# Patient Record
Sex: Male | Born: 1970 | Hispanic: Refuse to answer | Marital: Married | Smoking: Never smoker
Health system: Southern US, Community
[De-identification: ages and names within clinical notes are randomized; demographics above are authoritative.]

## PROBLEM LIST (undated history)

## (undated) DIAGNOSIS — E7800 Pure hypercholesterolemia, unspecified: Secondary | ICD-10-CM

## (undated) DIAGNOSIS — L0501 Pilonidal cyst with abscess: Secondary | ICD-10-CM

## (undated) DIAGNOSIS — Z8719 Personal history of other diseases of the digestive system: Secondary | ICD-10-CM

## (undated) HISTORY — DX: Pilonidal cyst with abscess: L05.01

## (undated) HISTORY — PX: PILONIDAL CYST EXCISION: SHX744

## (undated) HISTORY — DX: Pure hypercholesterolemia, unspecified: E78.00

## (undated) HISTORY — PX: INGUINAL HERNIA REPAIR: SUR1180

## (undated) HISTORY — DX: Personal history of other diseases of the digestive system: Z87.19

---

## 2015-03-14 ENCOUNTER — Ambulatory Visit (INDEPENDENT_AMBULATORY_CARE_PROVIDER_SITE_OTHER): Payer: BLUE CROSS/BLUE SHIELD | Admitting: Family

## 2015-03-14 ENCOUNTER — Encounter: Payer: Self-pay | Admitting: Family

## 2015-03-14 ENCOUNTER — Other Ambulatory Visit (INDEPENDENT_AMBULATORY_CARE_PROVIDER_SITE_OTHER): Payer: BLUE CROSS/BLUE SHIELD

## 2015-03-14 VITALS — BP 124/96 | HR 73 | Temp 98.3°F | Resp 18 | Ht 72.0 in | Wt 256.0 lb

## 2015-03-14 DIAGNOSIS — R5383 Other fatigue: Secondary | ICD-10-CM | POA: Diagnosis not present

## 2015-03-14 DIAGNOSIS — R202 Paresthesia of skin: Secondary | ICD-10-CM

## 2015-03-14 DIAGNOSIS — L0501 Pilonidal cyst with abscess: Secondary | ICD-10-CM | POA: Insufficient documentation

## 2015-03-14 DIAGNOSIS — R200 Anesthesia of skin: Secondary | ICD-10-CM

## 2015-03-14 HISTORY — DX: Pilonidal cyst with abscess: L05.01

## 2015-03-14 LAB — IBC PANEL
Iron: 97 ug/dL (ref 42–165)
SATURATION RATIOS: 25.2 % (ref 20.0–50.0)
Transferrin: 275 mg/dL (ref 212.0–360.0)

## 2015-03-14 LAB — CBC
HCT: 47.8 % (ref 39.0–52.0)
HEMOGLOBIN: 16.3 g/dL (ref 13.0–17.0)
MCHC: 34 g/dL (ref 30.0–36.0)
MCV: 87.2 fl (ref 78.0–100.0)
Platelets: 222 10*3/uL (ref 150.0–400.0)
RBC: 5.48 Mil/uL (ref 4.22–5.81)
RDW: 13.7 % (ref 11.5–15.5)
WBC: 7 10*3/uL (ref 4.0–10.5)

## 2015-03-14 LAB — B12 AND FOLATE PANEL
Folate: 18.6 ng/mL (ref >5.9)
VITAMIN B 12: 389 pg/mL (ref 211–911)

## 2015-03-14 LAB — COMPREHENSIVE METABOLIC PANEL
ALK PHOS: 64 U/L (ref 39–117)
ALT: 44 U/L (ref 0–53)
AST: 32 U/L (ref 0–37)
Albumin: 4.4 g/dL (ref 3.5–5.2)
BILIRUBIN TOTAL: 0.5 mg/dL (ref 0.2–1.2)
BUN: 20 mg/dL (ref 6–23)
CO2: 28 mEq/L (ref 19–32)
Calcium: 9.5 mg/dL (ref 8.4–10.5)
Chloride: 105 mEq/L (ref 96–112)
Creatinine, Ser: 1.03 mg/dL (ref 0.40–1.50)
GFR: 83.07 mL/min (ref >60.00)
GLUCOSE: 89 mg/dL (ref 70–99)
POTASSIUM: 4.1 meq/L (ref 3.5–5.1)
SODIUM: 139 meq/L (ref 135–145)
Total Protein: 6.9 g/dL (ref 6.0–8.3)

## 2015-03-14 LAB — HEMOGLOBIN A1C: HEMOGLOBIN A1C: 5.4 % (ref 4.6–6.5)

## 2015-03-14 LAB — TSH: TSH: 0.76 u[IU]/mL (ref 0.35–4.50)

## 2015-03-14 NOTE — Progress Notes (Signed)
Pre visit review using our clinic review tool, if applicable. No additional management support is needed unless otherwise documented below in the visit note. 

## 2015-03-14 NOTE — Assessment & Plan Note (Signed)
No cyst/abscess appreciable upon examination. Continue to monitor at this time.

## 2015-03-14 NOTE — Assessment & Plan Note (Signed)
Symptoms and exam of numbness and tingling are not present upon exam and only noted when driving for long distances. He does describe occasional numbness and tingling in his extremities. Obtain B12/folate, CBC, hemoglobin A1c, IBC panel, and thyroid to rule out metabolic causes.

## 2015-03-14 NOTE — Assessment & Plan Note (Signed)
Fatigue of undetermined origin. Obtain B12/folate, CBC, complete metabolic panel, hemoglobin A1c, IBC panel, and thyroid to rule out metabolic causes. Cannot rule out underlying anxiety/depression or cardiovascular disease. Follow-up pending lab work.

## 2015-03-14 NOTE — Patient Instructions (Addendum)
Thank you for choosing [facility].  Summary/Instructions:   Please stop by the lab on the basement level of the building for your blood work. Your results will be released to MyChart (or called to you) after review, usually within 72 hours after test completion. If any changes need to be made, you will be notified at that same time.  If your symptoms worsen or fail to improve, please contact our office for further instruction, or in case of emergency go directly to the emergency room at the closest medical facility.    Peripheral Neuropathy Peripheral neuropathy is a type of nerve damage. It affects nerves that carry signals between the spinal cord and other parts of the body. These are called peripheral nerves. With peripheral neuropathy, one nerve or a group of nerves may be damaged.  CAUSES  Many things can damage peripheral nerves. For some people with peripheral neuropathy, the cause is unknown. Some causes include:  Diabetes. This is the most common cause of peripheral neuropathy.  Injury to a nerve.  Pressure or stress on a nerve that lasts a long time.  Too little vitamin B. Alcoholism can lead to this.  Infections.  Autoimmune diseases, such as multiple sclerosis and systemic lupus erythematosus.  Inherited nerve diseases.  Some medicines, such as cancer drugs.  Toxic substances, such as lead and mercury.  Too little blood flowing to the legs.  Kidney disease.  Thyroid disease. SIGNS AND SYMPTOMS  Different people have different symptoms. The symptoms you have will depend on which of your nerves is damaged. Common symptoms include:  Loss of feeling (numbness) in the feet and hands.  Tingling in the feet and hands.  Pain that burns.  Very sensitive skin.  Weakness.  Not being able to move a part of the body (paralysis).  Muscle twitching.  Clumsiness or poor coordination.  Loss of balance.  Not being able to control your bladder.  Feeling  dizzy.  Sexual problems. DIAGNOSIS  Peripheral neuropathy is a symptom, not a disease. Finding the cause of peripheral neuropathy can be hard. To figure that out, your health care provider will take a medical history and do a physical exam. A neurological exam will also be done. This involves checking things affected by your brain, spinal cord, and nerves (nervous system). For example, your health care provider will check your reflexes, how you move, and what you can feel.  Other types of tests may also be ordered, such as:  Blood tests.  A test of the fluid in your spinal cord.  Imaging tests, such as CT scans or an MRI.  Electromyography (EMG). This test checks the nerves that control muscles.  Nerve conduction velocity tests. These tests check how fast messages pass through your nerves.  Nerve biopsy. A small piece of nerve is removed. It is then checked under a microscope. TREATMENT   Medicine is often used to treat peripheral neuropathy. Medicines may include:  Pain-relieving medicines. Prescription or over-the-counter medicine may be suggested.  Antiseizure medicine. This may be used for pain.  Antidepressants. These also may help ease pain from neuropathy.  Lidocaine. This is a numbing medicine. You might wear a patch or be given a shot.  Mexiletine. This medicine is typically used to help control irregular heart rhythms.  Surgery. Surgery may be needed to relieve pressure on a nerve or to destroy a nerve that is causing pain.  Physical therapy to help movement.  Assistive devices to help movement. HOME CARE INSTRUCTIONS     Only take over-the-counter or prescription medicines as directed by your health care provider. Follow the instructions carefully for any given medicines. Do not take any other medicines without first getting approval from your health care provider.  If you have diabetes, work closely with your health care provider to keep your blood sugar under  control.  If you have numbness in your feet:  Check every day for signs of injury or infection. Watch for redness, warmth, and swelling.  Wear padded socks and comfortable shoes. These help protect your feet.  Do not do things that put pressure on your damaged nerve.  Do not smoke. Smoking keeps blood from getting to damaged nerves.  Avoid or limit alcohol. Too much alcohol can cause a lack of B vitamins. These vitamins are needed for healthy nerves.  Develop a good support system. Coping with peripheral neuropathy can be stressful. Talk to a mental health specialist or join a support group if you are struggling.  Follow up with your health care provider as directed. SEEK MEDICAL CARE IF:   You have new signs or symptoms of peripheral neuropathy.  You are struggling emotionally from dealing with peripheral neuropathy.  You have a fever. SEEK IMMEDIATE MEDICAL CARE IF:   You have an injury or infection that is not healing.  You feel very dizzy or begin vomiting.  You have chest pain.  You have trouble breathing. Document Released: 09/05/2002 Document Revised: 05/28/2011 Document Reviewed: 05/23/2013 ExitCare Patient Information 2015 [employer]. This information is not intended to replace advice given to you by your health care provider. Make sure you discuss any questions you have with your health care provider.  

## 2015-03-14 NOTE — Progress Notes (Signed)
Subjective:    Patient ID: Jon Suarez, male    DOB: [birth year], 44 y.o.   MRN: [id number]  Chief Complaint  Patient presents with  . 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 male with a PMH of pineal cyst who presents today for an office visit to establish care.    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 Notes that when he started working out recently, he noticed pain located at the top of his buttock and felt a small, circular area in the same place the cyst was previously. Denies any modifying factors that make it better or worse. Pain is described as uncomfortable and noted when he is sitting in a particular position. Severity of the pain at worst 0-2/10.   2.) Left leg - Associated symptom of pain located in his left leg noted when he sits and drives for long periods of time. Describes the pain as shooting located on the medial aspect of his left thigh. Also notes that warmth and redness to his feet and hands. Progressively worsens as he drives and when he stands up at a rest stop or destination, the redness stays for a little while and the pain dissipates almost immediately.  3) Fatigue - Associated symptom of fatigue has been going on for several months. Does report weight gain and decreased activity.   Allergies  Allergen Reactions  . Codeine     Nausea, loopy, upset stomach     No outpatient prescriptions prior to visit.   No facility-administered medications prior to visit.     Past Medical History  Diagnosis Date  . Hypertension      Past Surgical History  Procedure Laterality Date  . Pineal cyst removal        Family History  Problem Relation Age of Onset  . Breast cancer Mother   . Heart attack Father   . Breast cancer Sister   . Healthy Maternal Grandmother   . Healthy Maternal Grandfather   . Healthy Paternal Grandmother   . Healthy Paternal Grandfather      History   Social History  . Marital Status: Married    Spouse Name: N/A  . Number of Children: 4  . Years of Education: 16   Occupational History  . Adult nurse    Social History Main Topics  . Smoking status: Never Smoker   . Smokeless tobacco: Never Used  . Alcohol Use: 18.0 oz/week    0 Standard drinks or equivalent, 30 Cans of beer per week  . Drug Use: No  . Sexual Activity: Not on file   Other Topics Concern  . Not on file   Social History Narrative   Fun: Drink beer, work around [employer], play basketball, run   Denies religious beliefs effecting health care.     Review of Systems  Constitutional: Positive for fatigue. Negative for fever and chills.  Endocrine: Positive for polydipsia. Negative for polyphagia and polyuria.  Neurological: Positive for numbness.      Objective:    BP  124/96 mmHg  Pulse 73  Temp(Src) 98.3 F (36.8 C) (Oral)  Resp 18  Ht 6' (1.829 m)  Wt 256 lb (116.121 kg)  BMI 34.71 kg/m2  SpO2 97% Nursing note and vital signs reviewed.  Physical Exam  Constitutional: He is oriented to person, place, and time. He appears well-developed and well-nourished. No distress.  Cardiovascular: Normal rate, regular rhythm, normal heart sounds and intact distal pulses.   Pulmonary/Chest: Effort normal and breath sounds normal.  Neurological: He is alert and oriented to person, place, and time.  Skin: Skin is warm and dry.  Psychiatric: He has a normal mood and affect. His behavior is normal. Judgment and thought content normal.       Assessment & Plan:   Problem List Items Addressed This Visit      Musculoskeletal and Integument   Pilonidal abscess    No  cyst/abscess appreciable upon examination. Continue to monitor at this time.        Other   Fatigue    Fatigue of undetermined origin. Obtain B12/folate, CBC, complete metabolic panel, hemoglobin A1c, IBC panel, and thyroid to rule out metabolic causes. Cannot rule out underlying anxiety/depression or cardiovascular disease. Follow-up pending lab work.      Relevant Orders   B12 and Folate Panel   CBC   Hemoglobin A1c   IBC panel   TSH   Comp Met (CMET)   Numbness and tingling - Primary    Symptoms and exam of numbness and tingling are not present upon exam and only noted when driving for long distances. He does describe occasional numbness and tingling in his extremities. Obtain B12/folate, CBC, hemoglobin A1c, IBC panel, and thyroid to rule out metabolic causes.      Relevant Orders   B12 and Folate Panel   CBC   Hemoglobin A1c   IBC panel   TSH   Comp Met (CMET)

## 2015-03-16 ENCOUNTER — Telehealth: Payer: Self-pay | Admitting: Family

## 2015-03-16 NOTE — Telephone Encounter (Signed)
LVM for pt to call back.

## 2015-03-16 NOTE — Telephone Encounter (Signed)
Please inform patient that his blood work shows that his B12/Folate, kidney function, liver function, electrolytes, thyroid and white/red blood cells are all within the normal limits. His A1c is 5.4 indicating that he does not have diabetes. Therefore there are no metabolic causes that can be identified that are causing his symptoms. Cannot rule out neuropathy or entrapment of a nerve when he sits for long periods of time. The next step if you are interested is to speak with neurology regarding the tingling in your extremities.

## 2015-03-20 NOTE — Telephone Encounter (Signed)
Patient is calling for the results of his lab work. °

## 2015-03-20 NOTE — Telephone Encounter (Signed)
Pt aware of results. Wants time to think about getting referred to neurology.

## 2015-03-20 NOTE — Telephone Encounter (Signed)
Patient is calling back.  Would like results of lab work.

## 2017-04-06 ENCOUNTER — Emergency Department (INDEPENDENT_AMBULATORY_CARE_PROVIDER_SITE_OTHER)
Admission: EM | Admit: 2017-04-06 | Discharge: 2017-04-06 | Disposition: A | Discharge status: Home / Self Care | Payer: BLUE CROSS/BLUE SHIELD | Source: Home / Self Care | Attending: Family Medicine | Admitting: Family Medicine

## 2017-04-06 ENCOUNTER — Emergency Department (HOSPITAL_BASED_OUTPATIENT_CLINIC_OR_DEPARTMENT_OTHER): Payer: BLUE CROSS/BLUE SHIELD

## 2017-04-06 ENCOUNTER — Encounter: Payer: Self-pay | Admitting: Emergency Medicine

## 2017-04-06 ENCOUNTER — Emergency Department (INDEPENDENT_AMBULATORY_CARE_PROVIDER_SITE_OTHER): Payer: BLUE CROSS/BLUE SHIELD

## 2017-04-06 DIAGNOSIS — K5730 Diverticulosis of large intestine without perforation or abscess without bleeding: Secondary | ICD-10-CM | POA: Diagnosis not present

## 2017-04-06 DIAGNOSIS — R1032 Left lower quadrant pain: Secondary | ICD-10-CM

## 2017-04-06 DIAGNOSIS — K5732 Diverticulitis of large intestine without perforation or abscess without bleeding: Secondary | ICD-10-CM

## 2017-04-06 LAB — POCT CBC W AUTO DIFF (K'VILLE URGENT CARE)

## 2017-04-06 MED ORDER — CIPROFLOXACIN HCL 500 MG PO TABS: 500.0000 mg | ORAL_TABLET | Freq: Two times a day (BID) | ORAL | 0 refills | Status: DC

## 2017-04-06 MED ORDER — IOPAMIDOL (ISOVUE-300) INJECTION 61%
100.0000 mL | Freq: Once | INTRAVENOUS | Status: AC | PRN
  Administered 2017-04-06: 100 mL via INTRAVENOUS

## 2017-04-06 MED ORDER — METRONIDAZOLE 500 MG PO TABS
500.0000 mg | ORAL_TABLET | Freq: Three times a day (TID) | ORAL | 0 refills | Status: DC
Start: 2017-04-06 — End: 2017-12-02

## 2017-04-06 NOTE — ED Provider Notes (Signed)
CSN: [id number]     Arrival date & time 04/06/17  0901 History   First MD Initiated Contact with Patient 04/06/17 0920     Chief Complaint  Patient presents with  . Abdominal Pain   (Consider location/radiation/quality/duration/timing/severity/associated sxs/prior Treatment) HPI  Jon Suarez is a 46 y.o. male presenting to UC with c/o 4-5 days of LLQ abdominal pain that has been constant since onset.  Pain is dull ache, 8/10, does not radiate. Pt was [facility], was directed to this UC for likely CT scan for suspected diverticulitis. No prior hx of diverticulitis for pt but his mother has had diverticulitis and had a rupture of her intestines.  Denies fever, nausea or vomiting but has had some diarrhea w/o blood or mucous in stool.  No urinary symptoms.  Hx of inguinal hernia repair, no other abdominal surgeries.    History reviewed. No pertinent past medical history. Past Surgical History:  Procedure Laterality Date  . pineal cyst removal     Family History  Problem Relation Age of Onset  . Breast cancer Mother   . Heart attack Father   . Healthy Maternal Grandmother   . Healthy Maternal Grandfather   . Healthy Paternal Grandmother   . Healthy Paternal Grandfather   . Breast cancer Sister    Social History  Substance Use Topics  . Smoking status: Never Smoker  . Smokeless tobacco: Never Used  . Alcohol use 18.0 oz/week    30 Cans of beer per week    Review of Systems  Constitutional: Negative for chills and fever.  Gastrointestinal: Positive for abdominal pain (LLQ) and diarrhea. Negative for blood in stool, constipation, nausea and vomiting.  Genitourinary: Negative for dysuria, flank pain, frequency and hematuria.  Musculoskeletal: Negative for back pain and myalgias.    Allergies  Codeine  Home Medications   Prior to Admission medications   Medication Sig Start Date End Date Taking? Authorizing Provider  ciprofloxacin  (CIPRO) 500 MG tablet Take 1 tablet (500 mg total) by mouth 2 (two) times daily. One po bid x 7 days 04/06/17   Lurene ShadowPhelps, Johana Hopkinson O, PA-C  metroNIDAZOLE (FLAGYL) 500 MG tablet Take 1 tablet (500 mg total) by mouth 3 (three) times daily. 04/06/17   Lurene ShadowPhelps, Macarius Ruark O, PA-C   Meds Ordered and Administered this Visit  Medications - No data to display  BP 139/90 (BP Location: Left Arm)   Pulse (!) 52   Temp 98.4 F (36.9 C) (Oral)   Ht 6' (1.829 m)   Wt 255 lb (115.7 kg)   SpO2 97%   BMI 34.58 kg/m  No data found.   Physical Exam  Constitutional: He is oriented to person, place, and time. He appears well-developed and well-nourished. No distress.  HENT:  Head: Normocephalic and atraumatic.  Eyes: EOM are normal.  Neck: Normal range of motion. Neck supple.  Cardiovascular: Normal rate and regular rhythm.   Pulmonary/Chest: Effort normal and breath sounds normal. No respiratory distress. He has no wheezes. He has no rales.  Abdominal: Soft. He exhibits no distension and no mass. There is tenderness. There is no rebound, no guarding and no CVA tenderness. No hernia.  Musculoskeletal: Normal range of motion.  Neurological: He is alert and oriented to person, place, and time.  Skin: Skin is warm and dry. He is not diaphoretic.  Psychiatric: He has a normal mood and affect. His behavior is normal.  Nursing note and vitals reviewed.   Urgent  Care Course     Procedures (including critical care time)  Labs Review Labs Reviewed  COMPLETE METABOLIC PANEL WITH GFR  POCT CBC W AUTO DIFF ([facility])    Imaging Review Ct Abdomen Pelvis W Contrast  Result Date: 04/06/2017 CLINICAL DATA:  Acute left lower quadrant abdominal pain. EXAM: CT ABDOMEN AND PELVIS WITH CONTRAST TECHNIQUE: Multidetector CT imaging of the abdomen and pelvis was performed using the standard protocol following bolus administration of intravenous contrast. CONTRAST:  ISOVUE-300 IOPAMIDOL (ISOVUE-300) INJECTION 61%  COMPARISON:  None. FINDINGS: Lower chest: No acute abnormality. Hepatobiliary: No gallstones or biliary dilatation is noted. Ill-defined hyperdense focus is seen adjacent to gallbladder fossa measuring approximately 1 cm. It is uncertain if this represents fatty sparing or possibly hemangioma or other abnormality. Pancreas: Unremarkable. No pancreatic ductal dilatation or surrounding inflammatory changes. Spleen: Normal in size without focal abnormality. Adrenals/Urinary Tract: Adrenal glands are unremarkable. Kidneys are normal, without renal calculi, focal lesion, or hydronephrosis. Bladder is unremarkable. Stomach/Bowel: The stomach appears normal. There is no evidence of bowel obstruction. The appendix appears normal. Diverticulitis is noted at the junction of the descending and sigmoid colon. Vascular/Lymphatic: No significant vascular findings are present. No enlarged abdominal or pelvic lymph nodes. Reproductive: Prostate is unremarkable. Other: No abdominal wall hernia or abnormality. No abdominopelvic ascites. Musculoskeletal: No acute or significant osseous findings. 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 IMPRESSION: Focal diverticulitis noted at the junction of the descending and sigmoid colon. No definite abscess formation is noted. Small ill-defined hyperdense focus seen adjacent to gallbladder fossa which may represent fatty sparing or possibly hemangioma or other abnormality. MRI of the liver is recommended for further evaluation on nonemergent basis. Electronically Signed   By: Lupita Raider, M.D.   On: 04/06/2017 12:28      MDM   1. Diverticulitis of colon without hemorrhage   2. LLQ abdominal pain   3. LLQ pain    Hx and exam c/w diverticulitis w/o complication  Incidental finding of possible hemangioma noted on CT.  Discussed imaging with pt. Rx: Cipro and Flagyl  Home care instructions for diverticulitis provided to pt.  Encouraged f/u with PCP. Pt  has new pt visit scheduled for August. Encouraged to let them know about today's visit including incidental finding so additional tests/imaging can be ordered as they see fit.  Pt agreeable.     Lurene Shadow, PA-C 04/06/17 1249

## 2017-04-06 NOTE — ED Triage Notes (Signed)
LLQ pain x 4.5 days, painful to touch and when moving around. Went to [facility] and they sent him here thinking he needs a scan for diverticulitis.

## 2017-04-06 NOTE — Discharge Instructions (Signed)
°  You should let your primary care provider know about today's visit so they can look over today's visit including your CT scan as you may need an MRI to further evaluate possible hemangioma near your gallbladder.    They may also want to schedule a follow up colonoscopy due to new onset diverticulitis.  This would likely be ordered after infection has cleared.

## 2017-04-07 ENCOUNTER — Telehealth: Payer: Self-pay | Admitting: *Deleted

## 2017-04-07 NOTE — Telephone Encounter (Signed)
Callback: Patient reports he is starting to improve. Encouraged completion of antibiotics.

## 2017-04-16 ENCOUNTER — Encounter: Payer: Self-pay | Admitting: Emergency Medicine

## 2017-04-16 ENCOUNTER — Emergency Department (INDEPENDENT_AMBULATORY_CARE_PROVIDER_SITE_OTHER)
Admission: EM | Admit: 2017-04-16 | Discharge: 2017-04-16 | Disposition: A | Discharge status: Home / Self Care | Payer: BLUE CROSS/BLUE SHIELD | Source: Home / Self Care | Attending: Family Medicine | Admitting: Family Medicine

## 2017-04-16 DIAGNOSIS — R1032 Left lower quadrant pain: Secondary | ICD-10-CM | POA: Diagnosis not present

## 2017-04-16 DIAGNOSIS — Z8719 Personal history of other diseases of the digestive system: Secondary | ICD-10-CM

## 2017-04-16 LAB — POCT CBC W AUTO DIFF (K'VILLE URGENT CARE)

## 2017-04-16 MED ORDER — AMOXICILLIN-POT CLAVULANATE 875-125 MG PO TABS: 1.0000 | ORAL_TABLET | Freq: Three times a day (TID) | ORAL | 0 refills | Status: DC

## 2017-04-16 NOTE — ED Triage Notes (Signed)
LLQ pain x 2 days. Finished meds 4 days ago pain started worse than before yesterday and today.

## 2017-04-16 NOTE — ED Provider Notes (Signed)
CSN: [id number]     Arrival date & time 04/16/17  1743 History   First MD Initiated Contact with Patient 04/16/17 1758     Chief Complaint  Patient presents with  . Diverticulitis   (Consider location/radiation/quality/duration/timing/severity/associated sxs/prior Treatment) HPI  Jon Suarez is a 46 y.o. male presenting to UC with c/o recurrent and worsening LLQ abdominal pain. He was initially seen on 04/06/17 for LLQ abdomina pain, had an abdominal CT scan which showed uncomplicated diverticulitis.  He completed a course of Ciprofloxacin 500mg  BID and metronidazole 500mg  TID for 7 days.  The last 2 days pain restarted and feels worse than before.  Pain is cramping, "gurgly" in that area, 6/10.  Denies fever, chills, n/v/d but he did have diarrhea during first week of pain when he was initially seen. He is still on a soft bland diet but slowly increasing to a normal diet. He did not call his PCP as he does not have his new patient appointment until August.  He has not had a colonoscopy. Pt notes that he has work obligations which makes it difficult for him to make follow up appointments    History reviewed. No pertinent past medical history. Past Surgical History:  Procedure Laterality Date  . pineal cyst removal     Family History  Problem Relation Age of Onset  . Breast cancer Mother   . Heart attack Father   . Healthy Maternal Grandmother   . Healthy Maternal Grandfather   . Healthy Paternal Grandmother   . Healthy Paternal Grandfather   . Breast cancer Sister    Social History  Substance Use Topics  . Smoking status: Never Smoker  . Smokeless tobacco: Never Used  . Alcohol use 18.0 oz/week    30 Cans of beer per week    Review of Systems  Constitutional: Negative for chills and fever.  Cardiovascular: Negative for chest pain and palpitations.  Gastrointestinal: Positive for abdominal pain (LLQ). Negative for diarrhea, nausea and vomiting.  Genitourinary: Negative for  dysuria, frequency and hematuria.  Musculoskeletal: Negative for arthralgias, back pain and myalgias.  Skin: Negative for rash.    Allergies  Codeine  Home Medications   Prior to Admission medications   Medication Sig Start Date End Date Taking? Authorizing Provider  amoxicillin-clavulanate (AUGMENTIN) 875-125 MG tablet Take 1 tablet by mouth 3 (three) times daily. For 7 days 04/16/17   Lurene Shadow, PA-C  ciprofloxacin (CIPRO) 500 MG tablet Take 1 tablet (500 mg total) by mouth 2 (two) times daily. One po bid x 7 days 04/06/17   Lurene Shadow, PA-C  metroNIDAZOLE (FLAGYL) 500 MG tablet Take 1 tablet (500 mg total) by mouth 3 (three) times daily. 04/06/17   Lurene Shadow, PA-C   Meds Ordered and Administered this Visit  Medications - No data to display  BP (!) 142/82 (BP Location: Left Arm)   Pulse 72   Temp 98.7 F (37.1 C) (Oral)   Ht 6' (1.829 m)   Wt 245 lb (111.1 kg)   SpO2 98%   BMI 33.23 kg/m  No data found.   Physical Exam  Constitutional: He is oriented to person, place, and time. He appears well-developed and well-nourished. No distress.  HENT:  Head: Normocephalic and atraumatic.  Mouth/Throat: Oropharynx is clear and moist.  Eyes: EOM are normal.  Neck: Normal range of motion.  Cardiovascular: Normal rate and regular rhythm.   Pulmonary/Chest: Effort normal and breath sounds normal. No respiratory distress. He has no  wheezes. He has no rales.  Abdominal: Soft. Bowel sounds are normal. He exhibits no distension and no mass. There is tenderness ( LLQ). There is no rebound, no guarding and no CVA tenderness.  Musculoskeletal: Normal range of motion.  Neurological: He is alert and oriented to person, place, and time.  Skin: Skin is warm and dry. He is not diaphoretic.  Psychiatric: He has a normal mood and affect. His behavior is normal.  Nursing note and vitals reviewed.   Urgent Care Course     Procedures (including critical care time)  Labs Review Labs  Reviewed  POCT CBC W AUTO DIFF ([facility])    Imaging Review No results found.    MDM   1. LLQ abdominal pain   2. History of diverticulitis    Pt presenting to UC with c/o recurrent LLQ pain after dx of diverticulitis on 04/06/17 and treatment with Cipro and Flagyl for 7 day.  Pt has not f/u with PCP and has not had colonoscopy.  Discussed risk and concern he may be developing an abscess.  Pt refusing to f/u in ED for CT and does not have plans to f/u with new PCP until August.   Pt is afebrile. Non-toxic appearing.  CBC: 11.2 Tenderness in LLQ  Discussed case with Dr. Cathren HarshBeese, agrees with strong recommendation to f/u with colonoscopy.  Will start pt on Augmentin TID for 7 days  Reiterated importance of follow up with PCP Contact info for [facility] also provided to pt for f/u to help with scheduling of colonoscopy. Pt info packet provided. Discussed symptoms that warrant emergent care in the ED.     Lurene Shadowhelps, Vlasta Baskin O, [place]PA-C 04/16/17 [phone]

## 2017-11-07 IMAGING — CT CT ABD-PELV W/ CM
2 of 5 series · 15 of 46 positions shown, 17 images · IV contrast (APPLIED)
Comparison: None.

CLINICAL DATA: Acute left lower quadrant abdominal pain.

EXAM:
CT ABDOMEN AND PELVIS WITH CONTRAST
TECHNIQUE: Multidetector CT imaging of the abdomen and pelvis was performed
using the standard protocol following bolus administration of
intravenous contrast.
CONTRAST:  100mL 46IQTZ-KUU IOPAMIDOL (46IQTZ-KUU) INJECTION 61%

[Series 2: axial st · axial · 0.98mm/px · z∈[-627,-87]mm · 12 of 120 slices shown, 14 images]
[im 6/120  soft-tissue]
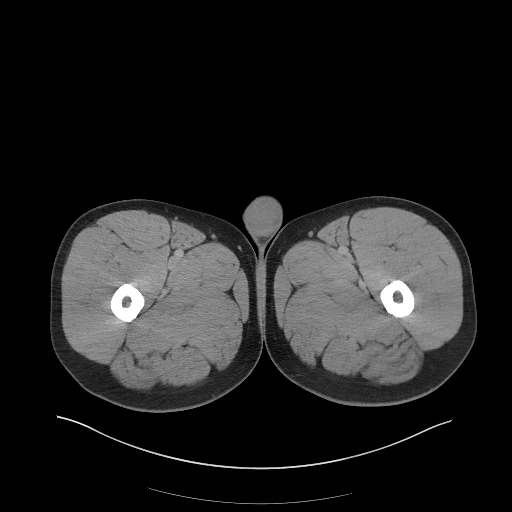
[im 6/120  bone]
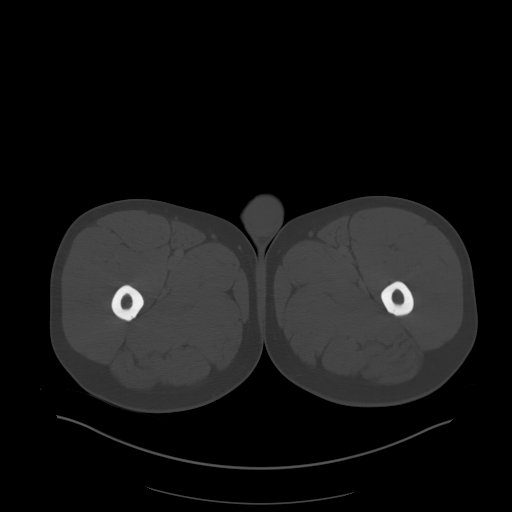
[im 18/120  soft-tissue]
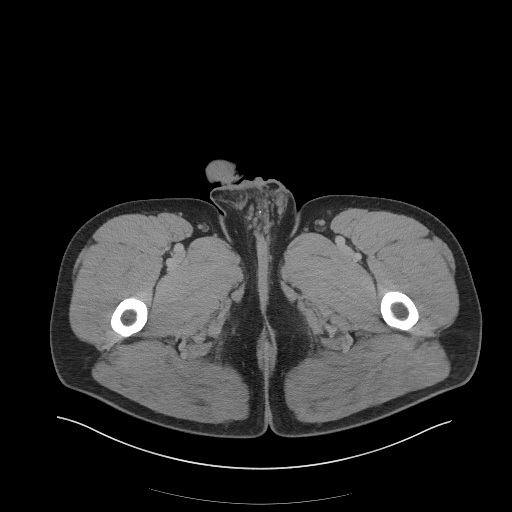
[im 24/120  soft-tissue]
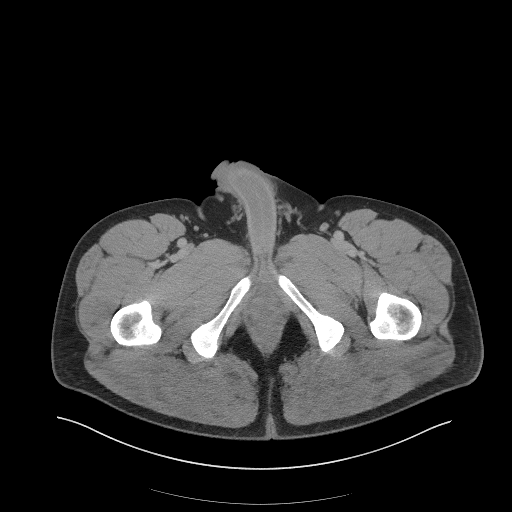
[im 36/120  soft-tissue]
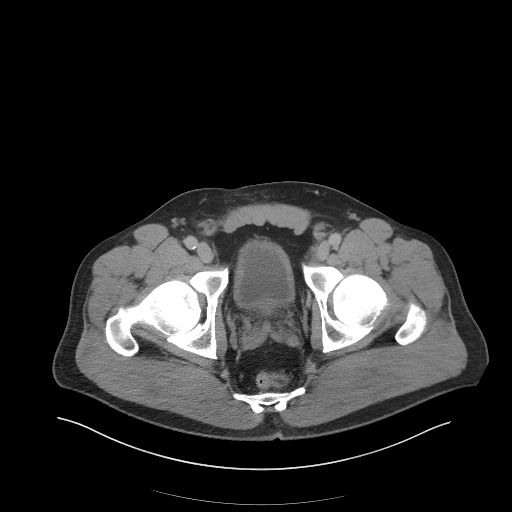
[im 48/120  soft-tissue]
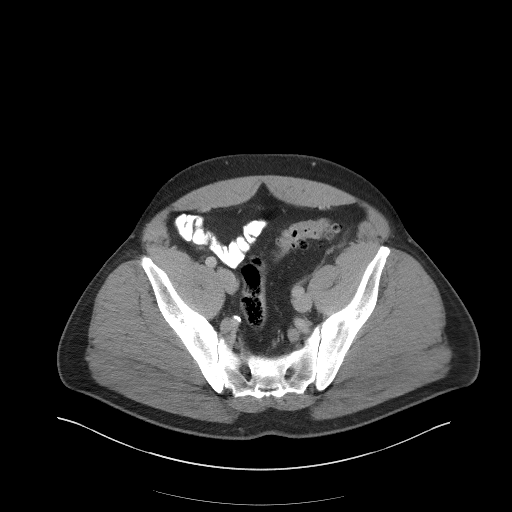
[im 54/120  soft-tissue]
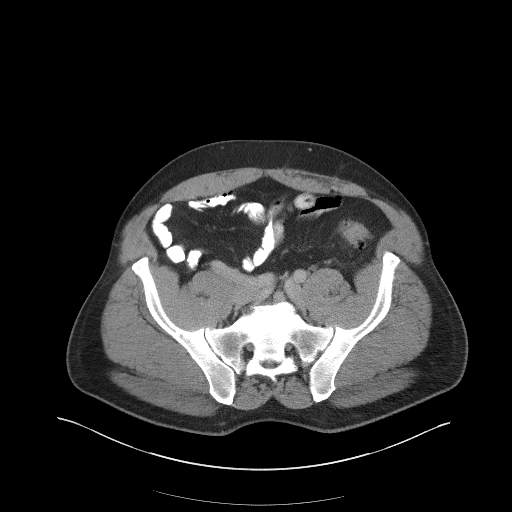
[im 66/120  soft-tissue]
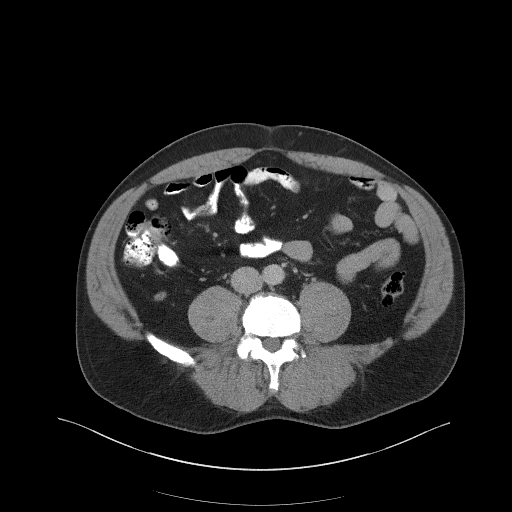
[im 72/120  soft-tissue]
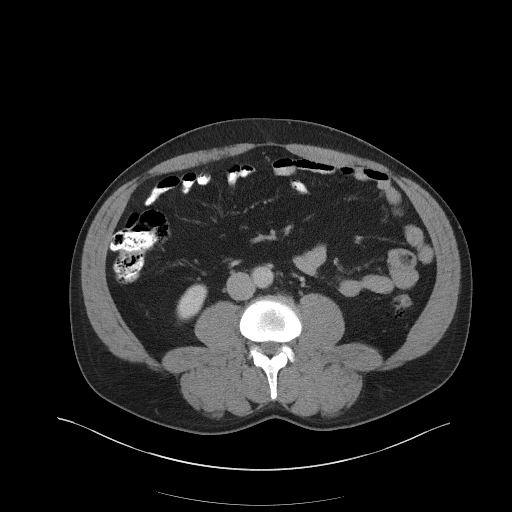
[im 84/120  soft-tissue]
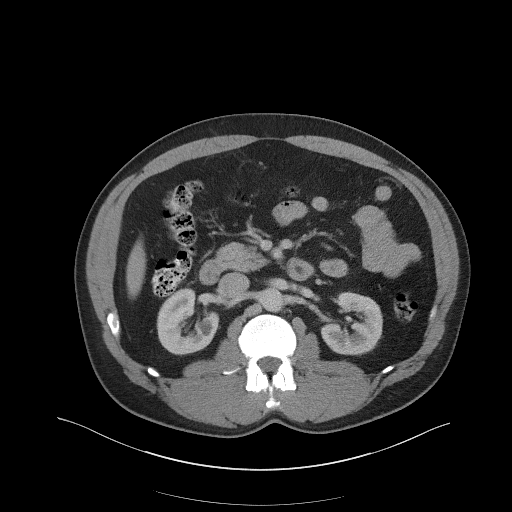
[im 84/120  bone]
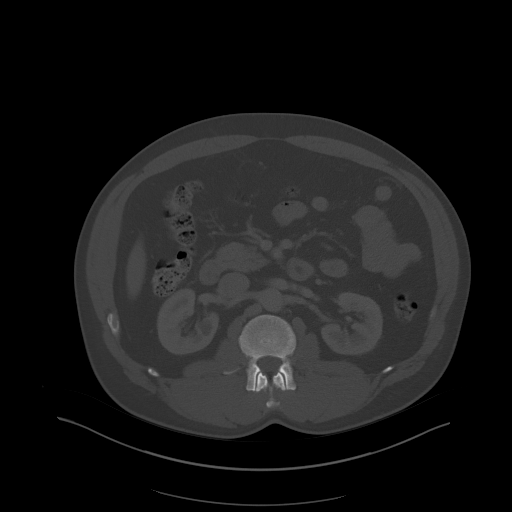
[im 96/120  soft-tissue]
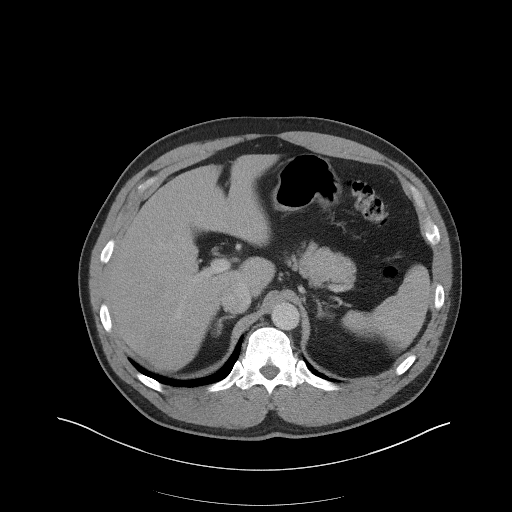
[im 102/120  soft-tissue]
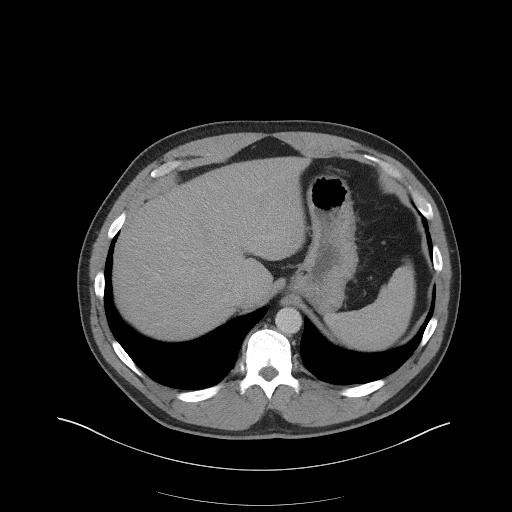
[im 114/120  soft-tissue]
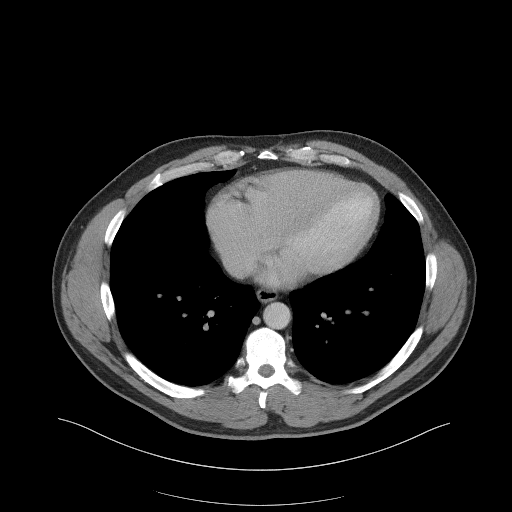

[Series 5: coronal st · coronal · 0.90mm/px · 3 of 109 slices shown]
[im 37/109  soft-tissue]
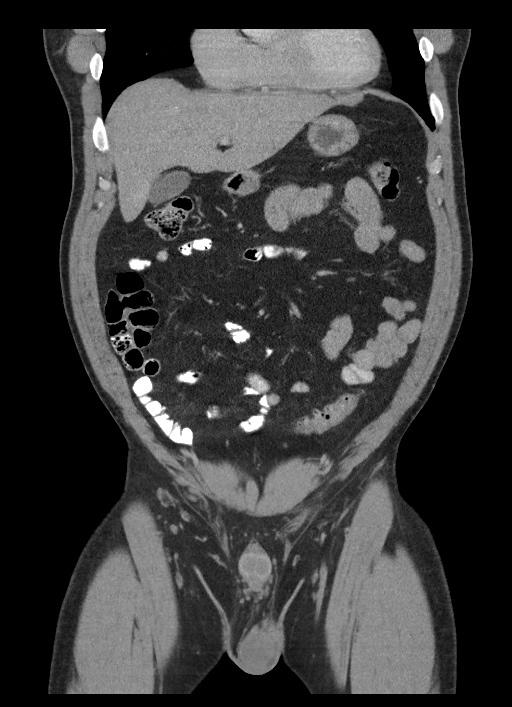
[im 49/109  soft-tissue]
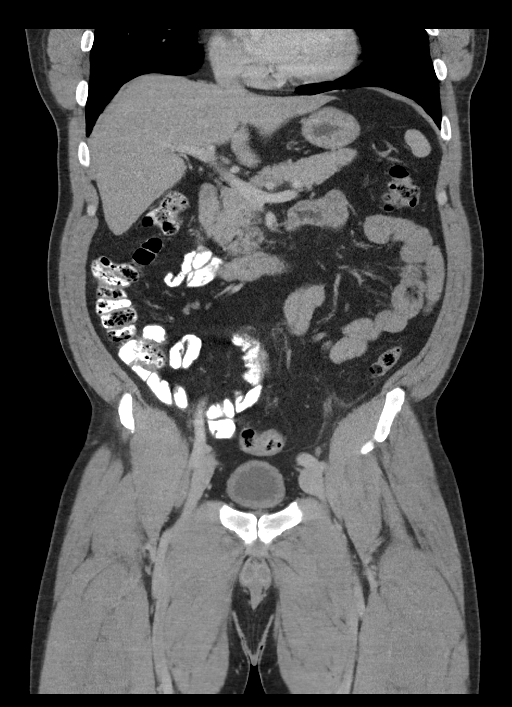
[im 61/109  soft-tissue]
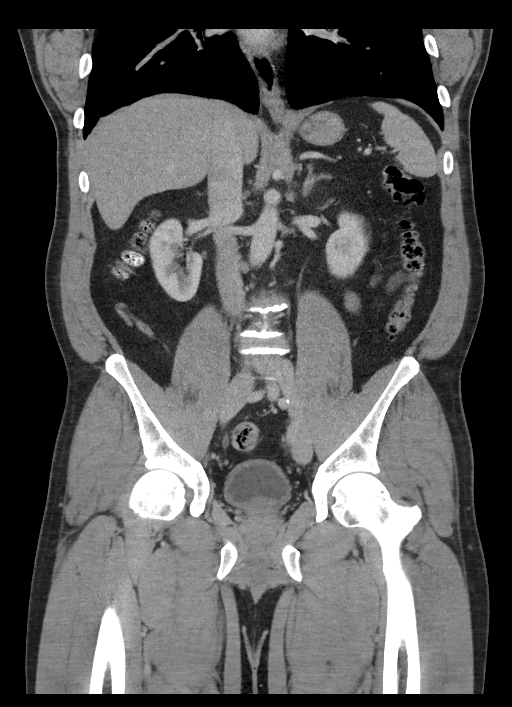

[15 of 46 positions shown; findings below may reference images not displayed]

FINDINGS: Lower chest: No acute abnormality.

Hepatobiliary: No gallstones or biliary dilatation is noted.
Ill-defined hyperdense focus is seen adjacent to gallbladder fossa
measuring approximately 1 cm. It is uncertain if this represents
fatty sparing or possibly hemangioma or other abnormality.

Pancreas: Unremarkable. No pancreatic ductal dilatation or
surrounding inflammatory changes.

Spleen: Normal in size without focal abnormality.

Adrenals/Urinary Tract: Adrenal glands are unremarkable. Kidneys are
normal, without renal calculi, focal lesion, or hydronephrosis.
Bladder is unremarkable.

Stomach/Bowel: The stomach appears normal. There is no evidence of
bowel obstruction. The appendix appears normal. Diverticulitis is
noted at the junction of the descending and sigmoid colon.

Vascular/Lymphatic: No significant vascular findings are present. No
enlarged abdominal or pelvic lymph nodes.

Reproductive: Prostate is unremarkable.

Other: No abdominal wall hernia or abnormality. No abdominopelvic
ascites.

Musculoskeletal: No acute or significant osseous findings.
Multilevel degenerative disc disease is noted in the lumbar spine.
IMPRESSION: Focal diverticulitis noted at the junction of the descending and
sigmoid colon. No definite abscess formation is noted.

Small ill-defined hyperdense focus seen adjacent to gallbladder
fossa which may represent fatty sparing or possibly hemangioma or
other abnormality. MRI of the liver is recommended for further
evaluation on nonemergent basis.

## 2017-11-17 ENCOUNTER — Other Ambulatory Visit: Payer: Self-pay | Admitting: Family Medicine

## 2017-11-17 ENCOUNTER — Ambulatory Visit
Admission: RE | Admit: 2017-11-17 | Discharge: 2017-11-17 | Disposition: A | Discharge status: Home / Self Care | Payer: BLUE CROSS/BLUE SHIELD | Source: Ambulatory Visit | Attending: Family Medicine | Admitting: Family Medicine

## 2017-11-17 DIAGNOSIS — R059 Cough, unspecified: Secondary | ICD-10-CM

## 2017-11-17 DIAGNOSIS — R05 Cough: Secondary | ICD-10-CM

## 2017-11-25 ENCOUNTER — Telehealth: Payer: Self-pay | Admitting: *Deleted

## 2017-11-25 NOTE — Telephone Encounter (Signed)
Referral sent to scheduling from Dr. Farris HasAaron Morrow, [phone].

## 2017-12-02 ENCOUNTER — Ambulatory Visit (INDEPENDENT_AMBULATORY_CARE_PROVIDER_SITE_OTHER): Payer: BLUE CROSS/BLUE SHIELD | Admitting: Physician Assistant

## 2017-12-02 VITALS — BP 132/90 | HR 60 | Ht 72.0 in | Wt 262.8 lb

## 2017-12-02 DIAGNOSIS — Z789 Other specified health status: Secondary | ICD-10-CM

## 2017-12-02 DIAGNOSIS — R0789 Other chest pain: Secondary | ICD-10-CM

## 2017-12-02 DIAGNOSIS — R0602 Shortness of breath: Secondary | ICD-10-CM | POA: Diagnosis not present

## 2017-12-02 DIAGNOSIS — R9431 Abnormal electrocardiogram [ECG] [EKG]: Secondary | ICD-10-CM | POA: Diagnosis not present

## 2017-12-02 NOTE — Patient Instructions (Addendum)
Medication Instructions:  1. Your physician recommends that you continue on your current medications as directed. Please refer to the Current Medication list given to you today.   Labwork: 1. TODAY ESR, CRP, PRO BNP  Testing/Procedures: 1. Your physician has requested that you have an echocardiogram. Echocardiography is a painless test that uses sound waves to create images of your heart. It provides your doctor with information about the size and shape of your heart and how well your heart's chambers and valves are working. This procedure takes approximately one hour. There are no restrictions for this procedure.  2. Your physician has requested that you have en exercise stress myoview. For further information please visit [email or web address]. Please follow instruction sheet, as given.   Follow-Up: [facility] , [facility] IN 3-4 WEEKS    Any Other Special Instructions Will Be Listed Below (If Applicable).     If you need a refill on your cardiac medications before your next appointment, please call your pharmacy.

## 2017-12-02 NOTE — Progress Notes (Signed)
Cardiology Office Note:    Date:  12/02/2017   ID:  Jon Suarez, DOB Jan 20, 1971, MRN [id number]  PCP:  Golden Circle, FNP  Cardiologist:  Sherren Mocha, MD / Richardson Dopp, PA-C   Referring MD: [name], MD   Chief Complaint  Patient presents with  . Chest Pain    History of Present Illness:    Jon Suarez is a 47 y.o. male with no significant PMH who is being seen today for the evaluation of chest pain at the request of [name], MD.   Jon Suarez is here alone today.  Over the last 5-10 weeks, he has been having substernal chest pressure that is constant.  It never resolves.  He does [employer] for exercise.  He has not had any change in his symptoms with exercise.  He did stop exercise early several days ago because he felt something was not right.  He has not exercised since.  Of note, he has been more short of breath with exercise recently and thought he had a respiratory illness.  He saw primary care and took antibiotics without a change in his symptoms.  He feels his chest pain more with taking a deep breath but has not had any supine chest pain.  He has felt uncomfortable lying flat at times.  He denies paroxysmal nocturnal dyspnea, significant edema or syncope.    PAD Screen 12/02/2017  Previous PAD dx? No  Previous surgical procedure? No  Pain with walking? No  Feet/toe relief with dangling? No  Painful, non-healing ulcers? No  Extremities discolored? No    Prior CV studies:   The following studies were reviewed today:  None   Past Medical History:  Diagnosis Date  . High cholesterol    improved with diet  . History of diverticulitis   . Pilonidal abscess 03/14/2015    Past Surgical History:  Procedure Laterality Date  . INGUINAL HERNIA REPAIR    . PILONIDAL CYST EXCISION      Current Medications: No outpatient medications have been marked as taking for the 12/02/17 encounter (Office Visit) with Richardson Dopp T, PA-C.     Allergies:    Codeine   Social History   Socioeconomic History  . Marital status: Married    Spouse name: None  . Number of children: 4  . Years of education: 59  . Highest education level: None  Social Needs  . Financial resource strain: None  . Food insecurity - worry: None  . Food insecurity - inability: None  . Transportation needs - medical: None  . Transportation needs - non-medical: None  Occupational History  . Occupation: Adult nurse  Tobacco Use  . Smoking status: Never Smoker  . Smokeless tobacco: Never Used  Substance and Sexual Activity  . Alcohol use: Yes    Alcohol/week: 18.0 oz    Types: 30 Cans of beer per week    Comment: 6-7 beers 3 x a week  . Drug use: No  . Sexual activity: None  Other Topics Concern  . None  Social History Narrative   Fun: Drink beer, work around [employer], play basketball, run   Denies religious beliefs effecting health care.    From [place] - moved to [facility] [place]   [place]   Currently at [employer] getting Masters Degree     Family Hx: The patient's family history includes Breast cancer in his mother and sister; Healthy in his maternal grandfather, maternal grandmother, paternal grandfather, and paternal grandmother;  Heart attack (age of onset: 32) in his father.  ROS:   Please see the history of present illness.    Review of Systems  Cardiovascular: Positive for chest pain and dyspnea on exertion.  Respiratory: Positive for cough and shortness of breath.    All other systems reviewed and are negative.   EKGs/Labs/Other Test Reviewed:    EKG:  EKG is  ordered today.  The ekg ordered today demonstrates normal sinus rhythm, heart rate 60, first-degree AV block, PR 226 ms, RSR prime V1-V2, nonspecific ST-T wave changes, QTC 418 ms  ECG from primary care dated 11/06/17 reviewed-sinus bradycardia, heart rate 55, normal axis, RSR prime V1-V2, nonspecific ST-T wave changes, QTC 418 ms  Recent Labs: No results found for requested  labs within last 8760 hours.  Labs from primary care 11/07/17: Creatinine 1.16, K 4.6, Hgb 15.5, d-dimer <0.20  Recent Lipid Panel No results found for: CHOL, TRIG, HDL, CHOLHDL, LDLCALC, LDLDIRECT  Dg Chest 2 View   Result Date: 11/17/2017 CLINICAL DATA:  Chest pressure and cough for several weeks EXAM: CHEST  2 VIEW COMPARISON:  None. FINDINGS: Cardiac shadow is within normal limits. The lungs are well aerated bilaterally. No focal infiltrate or sizable effusion is seen. No bony abnormality is noted.  IMPRESSION: No active cardiopulmonary disease. Electronically Signed   By: Inez Catalina M.D.   On: 11/17/2017 10:39     Physical Exam:    VS:  BP 132/90 (BP Location: Right Arm, Patient Position: Sitting, Cuff Size: Large)   Pulse 60   Ht 6' (1.829 m)   Wt 262 lb 12.8 oz (119.2 kg)   SpO2 96%   BMI 35.64 kg/m     Wt Readings from Last 3 Encounters:  12/02/17 262 lb 12.8 oz (119.2 kg)  04/16/17 245 lb (111.1 kg)  04/06/17 255 lb (115.7 kg)     Physical Exam  Constitutional: He is oriented to person, place, and time. He appears well-developed and well-nourished. No distress.  HENT:  Head: Normocephalic and atraumatic.  Eyes: No scleral icterus.  Neck: No JVD present. Carotid bruit is not present.  Cardiovascular: Normal rate and regular rhythm. Exam reveals no friction rub.  No murmur heard. Pulmonary/Chest: Effort normal. He has no wheezes. He has no rales.  Abdominal: Soft.  Musculoskeletal: He exhibits no edema.  Neurological: He is alert and oriented to person, place, and time.  Skin: Skin is warm and dry.    ASSESSMENT & PLAN:    #1.  Other chest pain  His symptoms of chest pain are fairly atypical for ischemia.  However, he did have to stop exercise early last week.  He has a remote FHx of CAD and he is over 92 yo.  He does have an incomplete RBBB on ECG and some non-specific TW changes.  Otherwise, there are no ischemic changes.  His symptoms are somewhat suspicious for  pericarditis.  However, he does not have diffuse ST elevation and no rub on exam.  I reviewed his case with Dr. Sherren Mocha who also saw the patient.  -Check ESR, CRP >> if elevated, start ibuprofen and colchicine  -obtain an Echocardiogram  -Arrange ETT-Myoview  -Follow up in 3-4 weeks.   #2.  Shortness of breath He has noted some dyspnea on exertion with his exercise regimen.  He does not appear volume overloaded.  I will obtain a BNP and an echocardiogram as noted.  If his cardiac tests are all unremarkable, we may want to consider  referring him to Pulmonary.  #3.  Abnormal EKG Incomplete RBBB and non-specific TW changes will make his ECG hard to interpret for the stress test.  He has also had some questionable exertional symptoms, FHx of CAD and he is > 8 yo.  Therefore, we will obtain and exercise nuclear stress perfusion study.    #4.  Alcohol consumption heavy I have recommended that he reduce his alcohol intake.     Dispo:  Return in about 4 weeks (around 12/30/2017) for Follow up after testing, w/ Richardson Dopp, PA-C.   Medication Adjustments/Labs and Tests Ordered: Current medicines are reviewed at length with the patient today.  Concerns regarding medicines are outlined above.  Orders/Tests:  Orders Placed This Encounter  Procedures  . Sedimentation rate  . C-reactive protein  . Pro b natriuretic peptide  . Myocardial Perfusion Imaging  . EKG 12-Lead  . ECHOCARDIOGRAM COMPLETE   Medication changes: No orders of the defined types were placed in this encounter.  Signed, Richardson Dopp, PA-C  12/02/2017 4:38 PM    [facility] [address], [place], [place]  [id number] Phone: [phone]; Fax: [phone]

## 2017-12-03 LAB — C-REACTIVE PROTEIN: CRP: 0.6 mg/L (ref 0.0–4.9)

## 2017-12-03 LAB — SEDIMENTATION RATE: SED RATE: 6 mm/h (ref 0–15)

## 2017-12-03 LAB — PRO B NATRIURETIC PEPTIDE: NT-PRO BNP: 20 pg/mL (ref 0–121)

## 2017-12-08 ENCOUNTER — Telehealth: Payer: Self-pay | Admitting: Physician Assistant

## 2017-12-08 NOTE — Telephone Encounter (Signed)
Patient canceled echo and stress test. Tereso NewcomerScott Vianney Kopecky, PA-C    12/08/2017 12:17 PM

## 2017-12-08 NOTE — Telephone Encounter (Signed)
-----   Message from Tarri Fullerarol M Fiato, CMA sent at 12/08/2017 11:22 AM EDT ----- Regarding: FW: ECHO/MYOVIEW FYI   Wanted to keep you in the loop.  ----- Message ----- From: Gerome SamMiller, Deborah D Sent: 12/08/2017   9:23 AM To: Tarri Fullerarol M Fiato, CMA Subject: ECHO/MYOVIEW                                   Per Lovie Macadamiaonya Yount-------Cancel Rsn: Patient (patient decided since preliminary testing is normal that he doesn't want to proceed with further testing)

## 2017-12-11 ENCOUNTER — Other Ambulatory Visit (HOSPITAL_COMMUNITY): Payer: BLUE CROSS/BLUE SHIELD

## 2017-12-11 ENCOUNTER — Encounter (HOSPITAL_COMMUNITY): Payer: BLUE CROSS/BLUE SHIELD

## 2017-12-28 ENCOUNTER — Ambulatory Visit: Payer: BLUE CROSS/BLUE SHIELD | Admitting: Physician Assistant

## 2018-06-20 IMAGING — CR DG CHEST 2V
2 series · 2 of 2 positions shown · non-contrast
Comparison: None.

CLINICAL DATA: Chest pressure and cough for several weeks

EXAM:
CHEST  2 VIEW

[w chest pa]
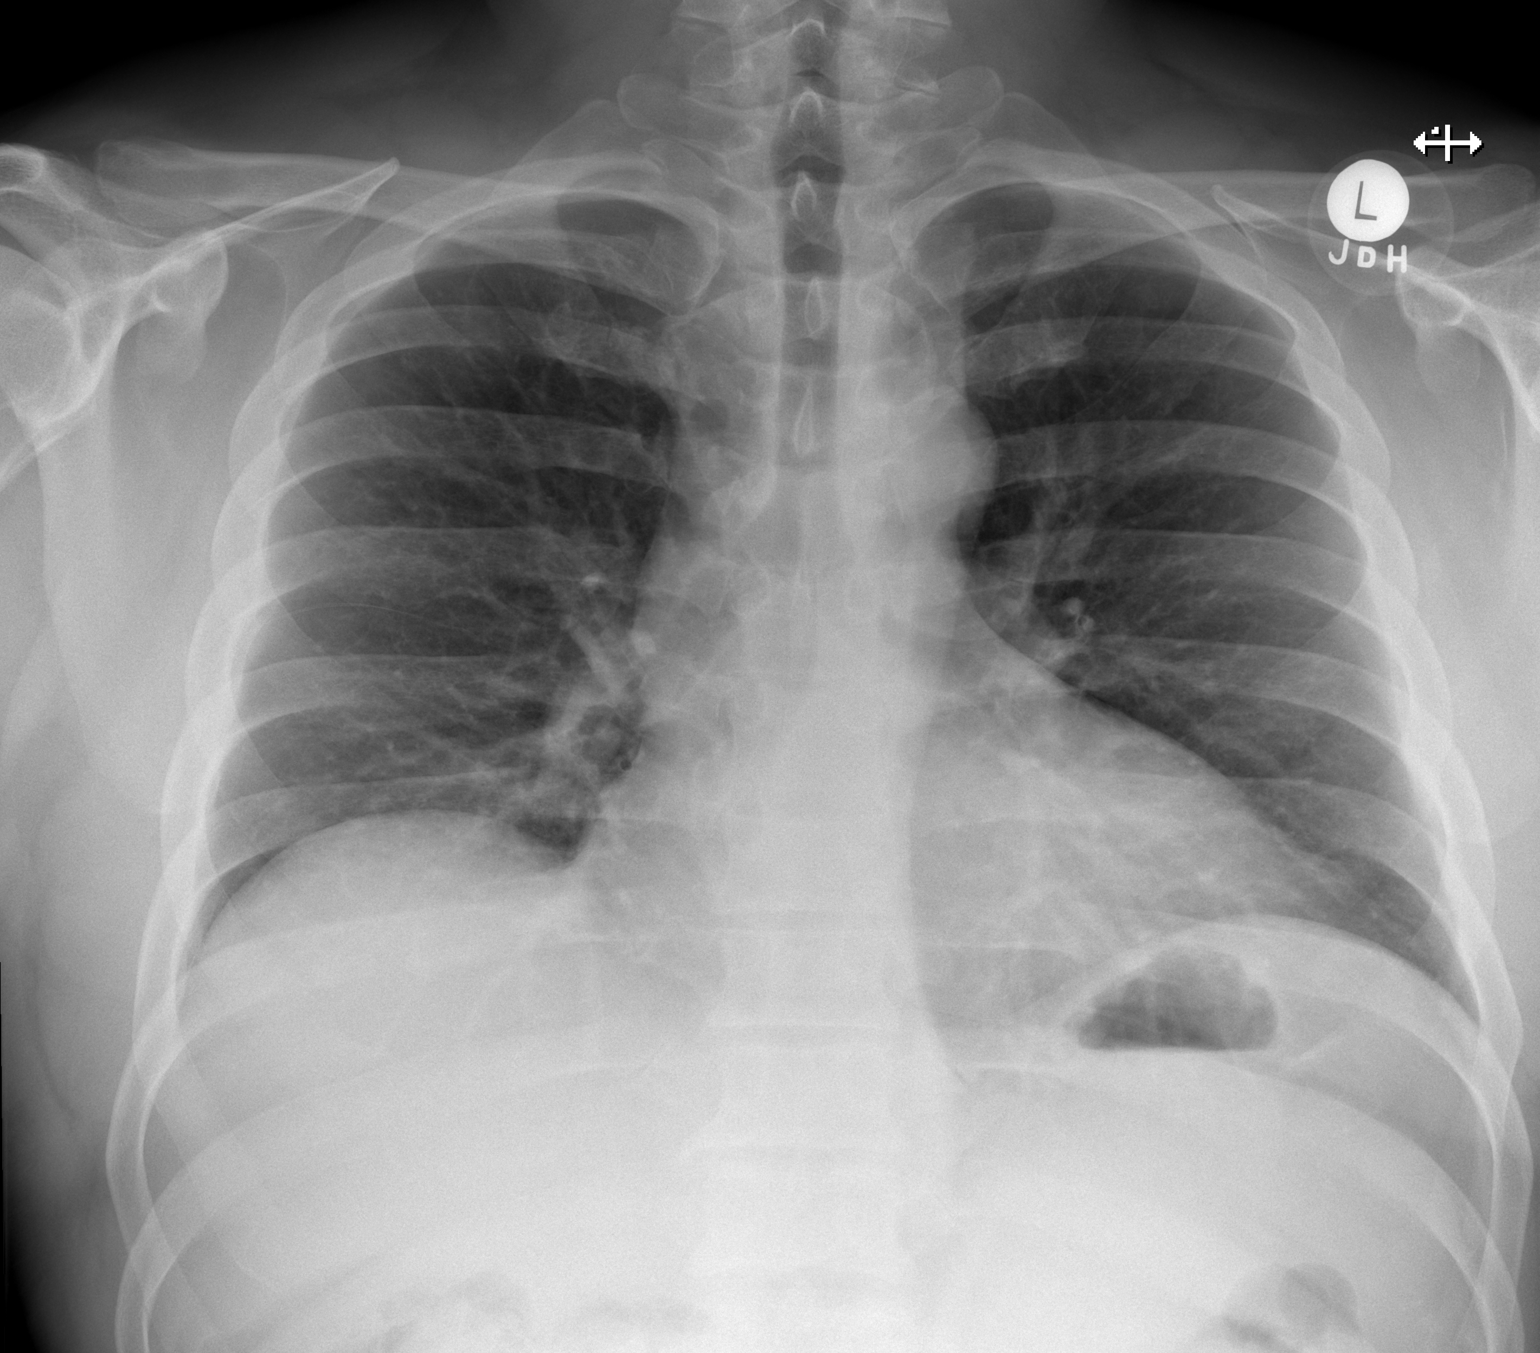

[w chest lat]
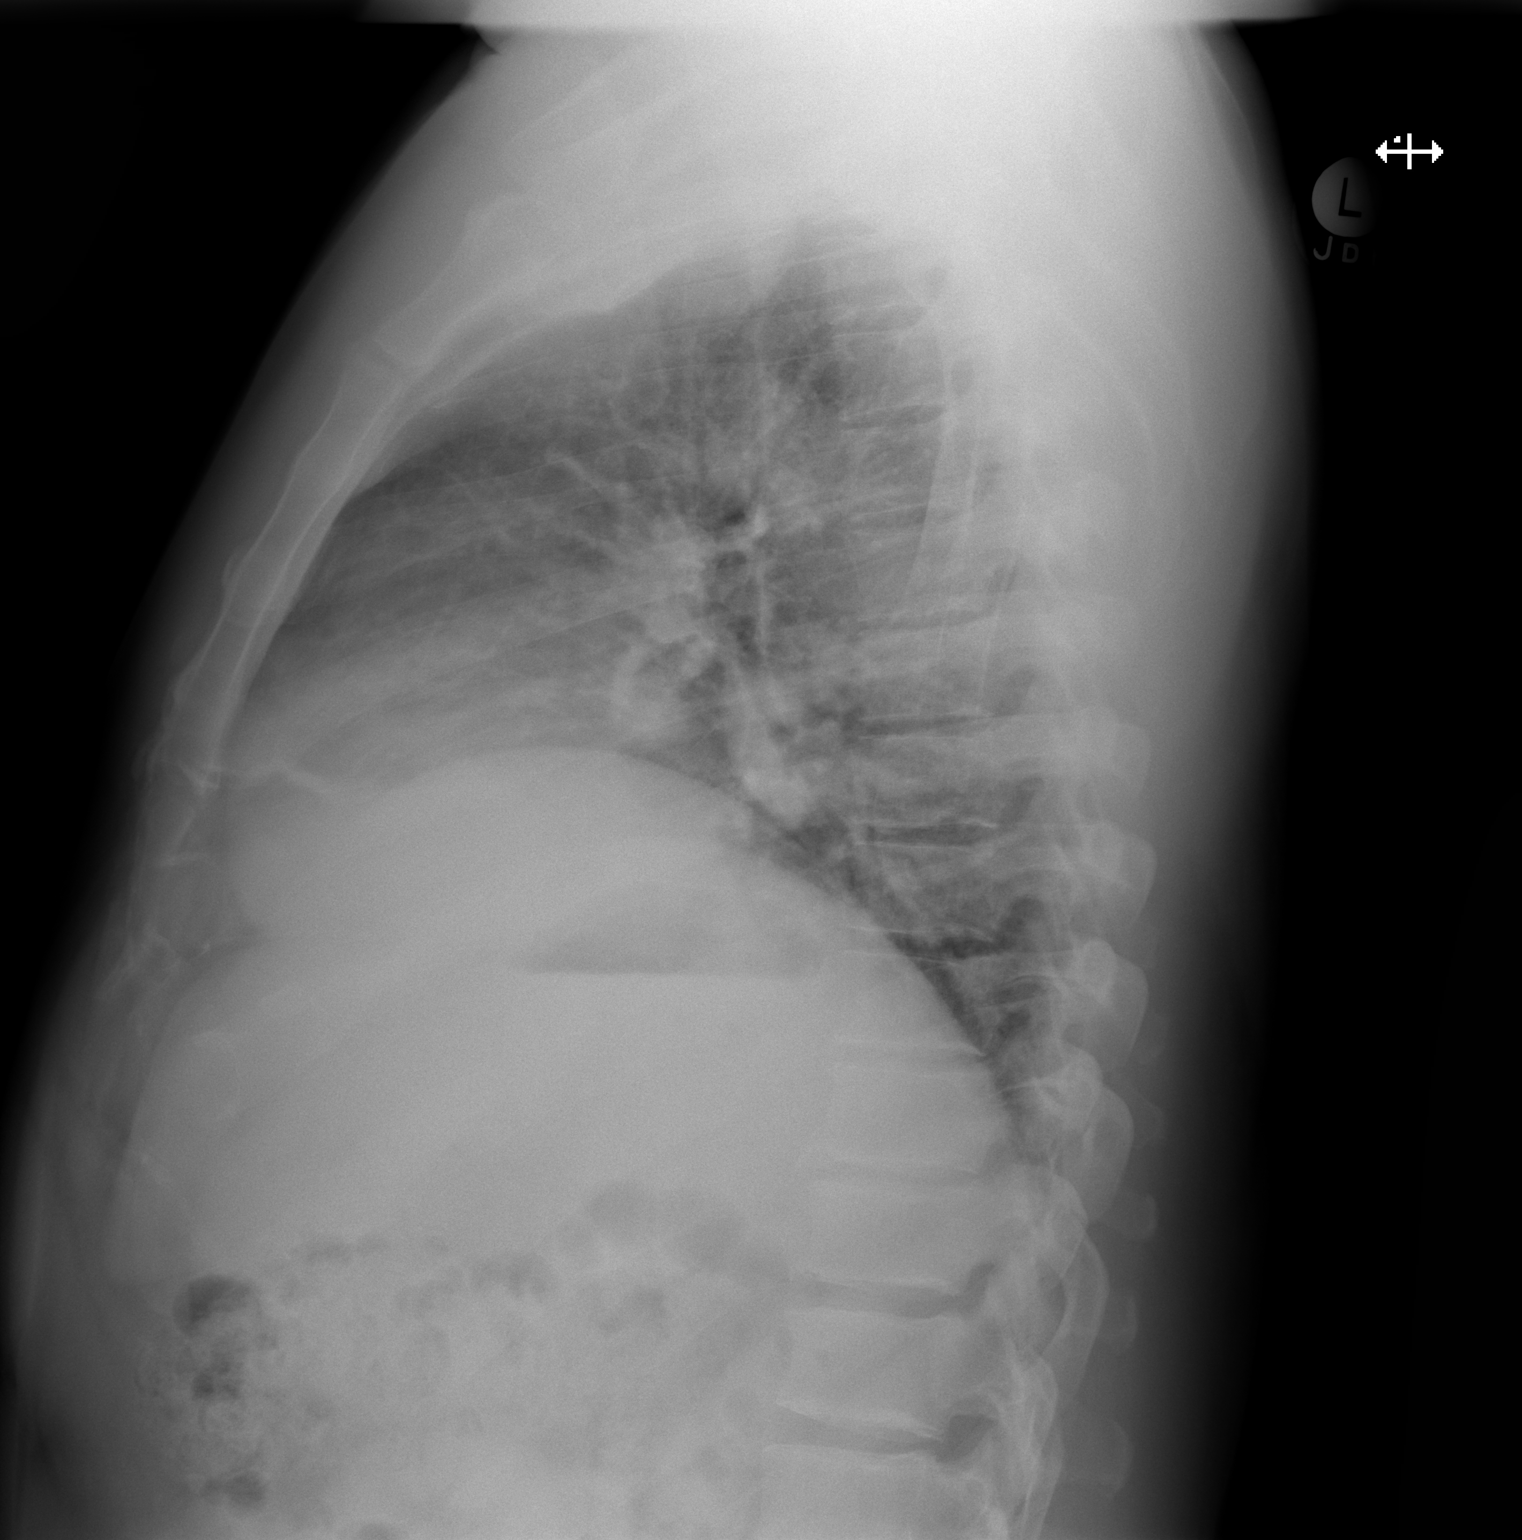

[2 of 2 positions shown; findings below may reference images not displayed]

FINDINGS: Cardiac shadow is within normal limits. The lungs are well aerated
bilaterally. No focal infiltrate or sizable effusion is seen. No
bony abnormality is noted.
IMPRESSION: No active cardiopulmonary disease.

## 2018-10-12 DIAGNOSIS — Z114 Encounter for screening for human immunodeficiency virus [HIV]: Secondary | ICD-10-CM | POA: Diagnosis not present

## 2018-10-12 DIAGNOSIS — R5383 Other fatigue: Secondary | ICD-10-CM | POA: Diagnosis not present

## 2018-10-12 DIAGNOSIS — R0602 Shortness of breath: Secondary | ICD-10-CM | POA: Diagnosis not present

## 2018-10-12 DIAGNOSIS — R350 Frequency of micturition: Secondary | ICD-10-CM | POA: Diagnosis not present

## 2018-10-12 DIAGNOSIS — Z7251 High risk heterosexual behavior: Secondary | ICD-10-CM | POA: Diagnosis not present

## 2018-10-12 DIAGNOSIS — Z125 Encounter for screening for malignant neoplasm of prostate: Secondary | ICD-10-CM | POA: Diagnosis not present

## 2018-10-12 DIAGNOSIS — Z0000 Encounter for general adult medical examination without abnormal findings: Secondary | ICD-10-CM | POA: Diagnosis not present

## 2018-10-12 DIAGNOSIS — E559 Vitamin D deficiency, unspecified: Secondary | ICD-10-CM | POA: Diagnosis not present

## 2019-08-24 DIAGNOSIS — Z20828 Contact with and (suspected) exposure to other viral communicable diseases: Secondary | ICD-10-CM | POA: Diagnosis not present

## 2019-09-12 DIAGNOSIS — R0600 Dyspnea, unspecified: Secondary | ICD-10-CM | POA: Diagnosis not present

## 2019-10-14 ENCOUNTER — Telehealth: Payer: Self-pay | Admitting: Cardiovascular Disease

## 2019-10-14 NOTE — Telephone Encounter (Signed)
Spoke with pt about coming in for an appointment because pt hasn't be seen since 11/2017 and was supposed to be seen back in 3-4 weeks per Tereso Newcomer, PA-C office note. Pt wants to get a stress test and doesn't want to come in for a an appointment to see a provider. The pt became really irate and states "you mean I have to come to be seen just to have a stress test". I informed pt that you haven't be seen in 2 years and he cut me off and said "f word this, I guess I won't care about my health." "F word this I don't need ya'll and will not be coming back to your office". Then pt hung up on me.

## 2019-10-14 NOTE — Telephone Encounter (Signed)
New message  Patient was contacted due to a referral that was sent in to rescheduled cancelled appointment for stress test and to do a DOE work up. Patient is unsure if another appointment is needed or if he can just reschedule myocardial perfusion. Order is needed for myocardial perfusion, if it needs to be rescheduled. Please assist.

## 2019-10-27 ENCOUNTER — Other Ambulatory Visit: Payer: Self-pay

## 2019-10-27 ENCOUNTER — Ambulatory Visit (INDEPENDENT_AMBULATORY_CARE_PROVIDER_SITE_OTHER): Payer: BC Managed Care – PPO | Admitting: Cardiology

## 2019-10-27 ENCOUNTER — Encounter: Payer: Self-pay | Admitting: Cardiology

## 2019-10-27 VITALS — BP 146/106 | HR 64 | Temp 97.2°F | Resp 96 | Ht 72.0 in | Wt 271.0 lb

## 2019-10-27 DIAGNOSIS — E8881 Metabolic syndrome: Secondary | ICD-10-CM

## 2019-10-27 DIAGNOSIS — Z8249 Family history of ischemic heart disease and other diseases of the circulatory system: Secondary | ICD-10-CM | POA: Diagnosis not present

## 2019-10-27 DIAGNOSIS — R0602 Shortness of breath: Secondary | ICD-10-CM | POA: Diagnosis not present

## 2019-10-27 DIAGNOSIS — E782 Mixed hyperlipidemia: Secondary | ICD-10-CM

## 2019-10-27 NOTE — Progress Notes (Signed)
Patient referred by [name], MD for dyspnea on exertion  Subjective:   Jon Suarez, male    DOB: 1971-06-27, 49 y.o.   MRN: [id number]  Chief Complaint  Patient presents with  . DOE  . Shortness of Breath  . New Patient (Initial Visit)     HPI  49 y.o. Caucasian male with family history of coronary artery disease, hyperlipidemia, now with dyspnea on exertion.  Patient has recently had episodes of exertional dyspnea and constant chest pressure.  Historically, he has had.  Some of similar symptoms happening in the past.  Similar symptoms occurred winter 2019/20, where he would have exertional dyspnea with minimal exercise related physical activity.  He had constant chest pressure which actually improved with physical activity.  These symptoms improved through spring and summer 2020, when he increase his physical activity.  He was walking/jogging upto 25,000 steps a day, without any significant difficulty.  However, over the last 2-3 months, his physical activity has dipped again.  He has been consuming up to 25-30 beers a week, and has been eating out almost every meal, as work has gotten busier.  His symptoms of dyspnea on exertion and constant chest pressure have returned.  He has had hyperlipidemia, with LDL measuring 191 in January 2020.  He is not on any statin.  His blood pressure is elevated today.  He does not have known history of hypertension.  That said, he has not checked his blood pressure since March 2020.  He has family history of early coronary artery disease.  His father had a fatal MI at age 70.   Past Medical History:  Diagnosis Date  . High cholesterol    improved with diet  . History of diverticulitis   . Pilonidal abscess 03/14/2015     Past Surgical History:  Procedure Laterality Date  . INGUINAL HERNIA REPAIR    . PILONIDAL CYST EXCISION       Social History   Tobacco Use  Smoking Status Never Smoker  Smokeless Tobacco Never Used     Social History   Substance and Sexual Activity  Alcohol Use Yes  . Alcohol/week: 30.0 standard drinks  . Types: 30 Cans of beer per week   Comment: 6-7 beers 3 x a week     Family History  Problem Relation Age of Onset  . Breast cancer Mother   . Heart attack Father 24  . Healthy Maternal Grandmother   . Healthy Maternal Grandfather   . Healthy Paternal Grandmother   . Healthy Paternal Grandfather   . Breast cancer Sister      No current outpatient medications on file prior to visit.   No current facility-administered medications on file prior to visit.    Cardiovascular and other pertinent studies:   EKG 10/27/2019: Sinus rhythm 63 bpm with sinus arhrtymia. Nonspecific T-abnormality.   Recent labs: No recent labs available.   Review of Systems  Constitution:       Snoring  Cardiovascular: Positive for chest pain (Constant, improved with exertion.) and dyspnea on exertion. Negative for leg swelling, palpitations and syncope.       Vitals:   10/27/19 1253 10/27/19 1254  BP: (!) 152/107 (!) 146/106  Pulse: 65 64  Resp: (!) 96   Temp: (!) 97.2 F (36.2 C)      Body mass index is 36.75 kg/m. Filed Weights   10/27/19 1253  Weight: 271 lb (122.9 kg)     Objective:   Physical Exam  Constitutional: He appears well-developed and well-nourished.  Moderately obese  Neck: No JVD present.  Cardiovascular: Normal rate, regular rhythm, normal heart sounds and intact distal pulses.  No murmur heard. Pulmonary/Chest: Effort normal and breath sounds normal. He has no wheezes. He has no rales.  Musculoskeletal:        General: No edema.  Nursing note and vitals reviewed.       Assessment & Recommendations:   49 y.o. Caucasian male with family history of coronary artery disease, hyperlipidemia, now with dyspnea on exertion.  Dyspnea on exertion: Suspect most likely etiology is deconditioning, obesity.  I will obtain echocardiogram.  Chest  pain: Very atypical, but patient has risk factors including elevated blood pressure, hyperlipidemia, family history of coronary artery disease.  Will obtain calcium score for restratification.  Patient has history of anxiety with scans.  May use Valium as needed.  Hyperlipidemia: LDL 191 in January 2020.  Will check lipid panel and lipoprotein a   He is reluctant to start statin at this time.  Diet and lifestyle modification discussion, as below.  Elevated blood pressure without diagnosis of hypertension: Multifactorial, including obesity, heavy alcohol intake, reduce physical activity.  He does not want to start antihypertensive therapy at this time.  We had a long discussion regarding diet and lifestyle modifications-including reduction in salt intake, reduction in eating out, reducing alcohol intake, and increasing physical activity as tolerated.  He will need follow-up for his blood pressure in the near future.  Obesity and possible obstructive sleep apnea, metabolic syndrome: Interrelated.  I have given him weight loss goal of losing 2 pounds per week. Encouraged using apps to track his calorie intake and physical activity. He is reluctant to consider sleep study and/or CPAP at this time.      Elder Negus, MD [facility] Pager: [phone] Office: [phone]

## 2019-10-28 ENCOUNTER — Encounter: Payer: Self-pay | Admitting: Cardiology

## 2019-10-28 DIAGNOSIS — R0602 Shortness of breath: Secondary | ICD-10-CM | POA: Insufficient documentation

## 2019-10-28 DIAGNOSIS — E8881 Metabolic syndrome: Secondary | ICD-10-CM | POA: Insufficient documentation

## 2019-10-28 DIAGNOSIS — Z8249 Family history of ischemic heart disease and other diseases of the circulatory system: Secondary | ICD-10-CM | POA: Insufficient documentation

## 2019-10-28 DIAGNOSIS — E782 Mixed hyperlipidemia: Secondary | ICD-10-CM | POA: Insufficient documentation

## 2019-10-31 ENCOUNTER — Other Ambulatory Visit: Payer: Self-pay

## 2019-10-31 ENCOUNTER — Ambulatory Visit (INDEPENDENT_AMBULATORY_CARE_PROVIDER_SITE_OTHER): Payer: BC Managed Care – PPO

## 2019-10-31 DIAGNOSIS — R0602 Shortness of breath: Secondary | ICD-10-CM | POA: Diagnosis not present

## 2019-11-01 DIAGNOSIS — R0602 Shortness of breath: Secondary | ICD-10-CM | POA: Diagnosis not present

## 2019-11-01 DIAGNOSIS — E8881 Metabolic syndrome: Secondary | ICD-10-CM | POA: Diagnosis not present

## 2019-11-01 DIAGNOSIS — Z8249 Family history of ischemic heart disease and other diseases of the circulatory system: Secondary | ICD-10-CM | POA: Diagnosis not present

## 2019-11-01 DIAGNOSIS — E782 Mixed hyperlipidemia: Secondary | ICD-10-CM | POA: Diagnosis not present

## 2019-11-02 LAB — TSH: TSH: 2.04 u[IU]/mL (ref 0.450–4.500)

## 2019-11-02 LAB — COMPREHENSIVE METABOLIC PANEL
ALT: 48 IU/L — ABNORMAL HIGH (ref 0–44)
AST: 38 IU/L (ref 0–40)
Albumin/Globulin Ratio: 2.1 (ref 1.2–2.2)
Albumin: 4.9 g/dL (ref 4.0–5.0)
Alkaline Phosphatase: 73 IU/L (ref 39–117)
BUN/Creatinine Ratio: 13 (ref 9–20)
BUN: 17 mg/dL (ref 6–24)
Bilirubin Total: 0.6 mg/dL (ref 0.0–1.2)
CO2: 25 mmol/L (ref 20–29)
Calcium: 10.3 mg/dL — ABNORMAL HIGH (ref 8.7–10.2)
Chloride: 100 mmol/L (ref 96–106)
Creatinine, Ser: 1.26 mg/dL (ref 0.76–1.27)
GFR calc Af Amer: 77 mL/min/{1.73_m2} (ref >59)
GFR calc non Af Amer: 67 mL/min/{1.73_m2} (ref >59)
Globulin, Total: 2.3 g/dL (ref 1.5–4.5)
Glucose: 106 mg/dL — ABNORMAL HIGH (ref 65–99)
Potassium: 5.3 mmol/L — ABNORMAL HIGH (ref 3.5–5.2)
Sodium: 140 mmol/L (ref 134–144)
Total Protein: 7.2 g/dL (ref 6.0–8.5)

## 2019-11-02 LAB — CBC
Hematocrit: 48 % (ref 37.5–51.0)
Hemoglobin: 17 g/dL (ref 13.0–17.7)
MCH: 31.5 pg (ref 26.6–33.0)
MCHC: 35.4 g/dL (ref 31.5–35.7)
MCV: 89 fL (ref 79–97)
Platelets: 235 10*3/uL (ref 150–450)
RBC: 5.39 x10E6/uL (ref 4.14–5.80)
RDW: 13.2 % (ref 11.6–15.4)
WBC: 6.3 10*3/uL (ref 3.4–10.8)

## 2019-11-02 LAB — LIPOPROTEIN A (LPA): Lipoprotein (a): 75.3 nmol/L — ABNORMAL HIGH (ref <75.0)

## 2019-11-02 NOTE — Progress Notes (Signed)
I called [facility] add-on testing and they will add the lipid panel

## 2019-11-02 NOTE — Progress Notes (Signed)
Can you check if he had lipid panel checked? If not, can you check with [facility] if they can check it on the same sample they checked other labs on 2/2?  Thanks MJP

## 2019-11-24 LAB — LIPID PANEL
Chol/HDL Ratio: 5.3 ratio — ABNORMAL HIGH (ref 0.0–5.0)
Cholesterol, Total: 260 mg/dL — ABNORMAL HIGH (ref 100–199)
HDL: 49 mg/dL (ref >39)
LDL Chol Calc (NIH): 174 mg/dL — ABNORMAL HIGH (ref 0–99)
Triglycerides: 197 mg/dL — ABNORMAL HIGH (ref 0–149)
VLDL Cholesterol Cal: 37 mg/dL (ref 5–40)

## 2019-11-24 LAB — SPECIMEN STATUS REPORT

## 2019-12-02 ENCOUNTER — Encounter: Payer: Self-pay | Admitting: Cardiology

## 2019-12-02 ENCOUNTER — Ambulatory Visit: Payer: BC Managed Care – PPO | Admitting: Cardiology

## 2019-12-02 ENCOUNTER — Other Ambulatory Visit: Payer: Self-pay

## 2019-12-02 VITALS — BP 167/107 | HR 79 | Temp 98.2°F | Ht 72.0 in | Wt 271.6 lb

## 2019-12-02 DIAGNOSIS — E782 Mixed hyperlipidemia: Secondary | ICD-10-CM

## 2019-12-02 DIAGNOSIS — Z8249 Family history of ischemic heart disease and other diseases of the circulatory system: Secondary | ICD-10-CM

## 2019-12-02 DIAGNOSIS — E8881 Metabolic syndrome: Secondary | ICD-10-CM

## 2019-12-02 NOTE — Progress Notes (Signed)
Patient referred by [name], MD for dyspnea on exertion  Subjective:   Jon Suarez, male    DOB: 08-Mar-1971, 49 y.o.   MRN: [id number]   Chief Complaint  Patient presents with  . Shortness of Breath  . Follow-up    4 week     HPI  49 y.o. Caucasian male with family history of coronary artery disease, hyperlipidemia, dyspnea on exertion.  His LDL has remained unchanged compared to 09/2018. Echocardiogram showed mild AI. Grade 1 DD. He has made changes to his diet- is eating out less. He is working out regular with P3 beachbody workouts. These are 30 min-weight training and cardio. He does not have any difficulty with chest pain during these workouts. He has mild, unchanged shortness of breath during these workouts. He did not undergo calcium score scan as recommended. His blood pressure remains elevated.   Initial consultation visit 09/2019: Patient has recently had episodes of exertional dyspnea and constant chest pressure.  Historically, he has had.  Some of similar symptoms happening in the past.  Similar symptoms occurred winter 2019/20, where he would have exertional dyspnea with minimal exercise related physical activity.  He had constant chest pressure which actually improved with physical activity.  These symptoms improved through spring and summer 2020, when he increase his physical activity.  He was walking/jogging upto 25,000 steps a day, without any significant difficulty.  However, over the last 2-3 months, his physical activity has dipped again.  He has been consuming up to 25-30 beers a week, and has been eating out almost every meal, as work has gotten busier.  His symptoms of dyspnea on exertion and constant chest pressure have returned.  He has had hyperlipidemia, with LDL measuring 191 in January 2020.  He is not on any statin.  His blood pressure is elevated today.  He does not have known history of hypertension.  That said, he has not checked his blood  pressure since March 2020.  He has family history of early coronary artery disease.  His father had a fatal MI at age 71.    No current outpatient medications on file prior to visit.   No current facility-administered medications on file prior to visit.    Cardiovascular and other pertinent studies:  Echocardiogram 10/31/2019:  Left ventricle cavity is normal in size. Mild concentric hypertrophy of  the left ventricle. Normal LV systolic function with visual EF 50-55%.  Normal global wall motion. Doppler evidence of grade I (impaired)  diastolic dysfunction, elevated LAP.  Left atrial cavity is mildly dilated.  Trileaflet aortic valve. Mild (Grade I) aortic regurgitation.  Mild tricuspid regurgitation. Estimated pulmonary artery systolic pressure  is 24 mmHg.  EKG 10/27/2019: Sinus rhythm 63 bpm with sinus arhrtymia. Nonspecific T-abnormality.   Recent labs: 11/01/2019: Glucose 106, BUN/Cr 17/1.26. EGFR 67. Na/K 140/5.3. ALT 48. Rest of the CMP normal H/H 17/48. MCV 89. Platelets 235 Chol 260, TG 197, HDL 49, LDL 174  (LDL was 191 in 09/2018) Lipoprotein a 75, just above ULN NT-proBNP 20 TSH 2.0normal  2016: HbA1C 5.4%   Review of Systems  Constitution:       Snoring  Cardiovascular: Positive for chest pain (Constant, improved with exertion.) and dyspnea on exertion. Negative for leg swelling, palpitations and syncope.       Vitals:   12/02/19 1353 12/02/19 1354  BP: (!) 155/105 (!) 167/107  Pulse: 79 79  Temp: 98.2 F (36.8 C)   SpO2: 98%  Body mass index is 36.84 kg/m. Filed Weights   12/02/19 1353  Weight: 271 lb 9.6 oz (123.2 kg)     Objective:   Physical Exam  Constitutional: He appears well-developed and well-nourished.  Moderately obese  Neck: No JVD present.  Cardiovascular: Normal rate, regular rhythm, normal heart sounds and intact distal pulses.  No murmur heard. Pulmonary/Chest: Effort normal and breath sounds normal. He has no wheezes.  He has no rales.  Musculoskeletal:        General: No edema.  Nursing note and vitals reviewed.       Assessment & Recommendations:   49 y.o. Caucasian male with family history of coronary artery disease, hyperlipidemia, dyspnea on exertion.  Dyspnea on exertion: Suspect most likely etiology is deconditioning, obesity. Normal EF, mild AI on echocardiogram.   I will obtain echocardiogram.  Chest pain: Very atypical, but patient has risk factors including elevated blood pressure, hyperlipidemia, family history of coronary artery disease. Again, recommended calcium score for risk stratification.  Patient has history of anxiety with scans.  May use Valium as needed.  Hyperlipidemia LDL remains elevated at 174. Again, he would like to avoid medications if possible. Calcium score scan will help with risk stratification.  Elevated blood pressure without diagnosis of hypertension: Reportedly lower at home. He is going to monitor his blood pressure regularly and follow up at next visit.   Obesity and possible obstructive sleep apnea, metabolic syndrome: Interrelated.  I have given him weight loss goal of losing 2 pounds per week. Encouraged using apps to track his calorie intake and physical activity. He is reluctant to consider sleep study and/or CPAP at this time.   Virtual visit f/u in 4 weeks.    Nigel Mormon, MD [facility] Pager: [phone] Office: [phone]

## 2019-12-29 ENCOUNTER — Telehealth: Payer: Self-pay

## 2019-12-29 NOTE — Telephone Encounter (Signed)
pt has not had his cardiac ct. VV for Monday was cancelled and he will call back to reschedule

## 2020-01-02 ENCOUNTER — Telehealth: Payer: BC Managed Care – PPO | Admitting: Cardiology

## 2020-01-06 ENCOUNTER — Ambulatory Visit: Payer: BC Managed Care – PPO | Attending: Internal Medicine

## 2020-01-06 DIAGNOSIS — Z23 Encounter for immunization: Secondary | ICD-10-CM

## 2020-01-06 NOTE — Progress Notes (Signed)
   Covid-19 Vaccination Clinic  Name:  Jon Suarez    MRN: [id number] DOB: [birth year]  01/06/2020  [name] was observed post Covid-19 immunization for 15 minutes without incident. He was provided with Vaccine Information Sheet and instruction to access the V-Safe system.   [name] was instructed to call 911 with any severe reactions post vaccine: Marland Kitchen Difficulty breathing  . Swelling of face and throat  . A fast heartbeat  . A bad rash all over body  . Dizziness and weakness   Immunizations Administered    Name Date Dose VIS Date Route   Pfizer COVID-19 Vaccine 01/06/2020  8:12 AM 0.3 mL 09/09/2019 Intramuscular   Manufacturer: [employer], [employer]   Lot: [id number]   NDC: [address]-4

## 2020-02-06 ENCOUNTER — Ambulatory Visit: Payer: BC Managed Care – PPO | Attending: Internal Medicine

## 2020-02-06 DIAGNOSIS — Z23 Encounter for immunization: Secondary | ICD-10-CM

## 2020-02-06 NOTE — Progress Notes (Signed)
   Covid-19 Vaccination Clinic  Name:  Jon Suarez    MRN: [id number] DOB: 04-04-1971  02/06/2020  [name] was observed post Covid-19 immunization for 15 minutes without incident. He was provided with Vaccine Information Sheet and instruction to access the V-Safe system.   [name] was instructed to call 911 with any severe reactions post vaccine: Marland Kitchen Difficulty breathing  . Swelling of face and throat  . A fast heartbeat  . A bad rash all over body  . Dizziness and weakness   Immunizations Administered    Name Date Dose VIS Date Route   Pfizer COVID-19 Vaccine 02/06/2020  8:11 AM 0.3 mL 11/23/2018 Intramuscular   Manufacturer: [employer], [employer]   Lot: [id number]   NDC: [address]-5

## 2020-06-07 DIAGNOSIS — B078 Other viral warts: Secondary | ICD-10-CM | POA: Diagnosis not present

## 2020-06-07 DIAGNOSIS — L821 Other seborrheic keratosis: Secondary | ICD-10-CM | POA: Diagnosis not present

## 2020-06-07 DIAGNOSIS — B353 Tinea pedis: Secondary | ICD-10-CM | POA: Diagnosis not present

## 2020-06-07 DIAGNOSIS — L820 Inflamed seborrheic keratosis: Secondary | ICD-10-CM | POA: Diagnosis not present

## 2020-06-07 DIAGNOSIS — D2371 Other benign neoplasm of skin of right lower limb, including hip: Secondary | ICD-10-CM | POA: Diagnosis not present

## 2020-06-07 DIAGNOSIS — C44519 Basal cell carcinoma of skin of other part of trunk: Secondary | ICD-10-CM | POA: Diagnosis not present

## 2020-11-09 DIAGNOSIS — Z23 Encounter for immunization: Secondary | ICD-10-CM | POA: Diagnosis not present

## 2020-11-09 DIAGNOSIS — Z111 Encounter for screening for respiratory tuberculosis: Secondary | ICD-10-CM | POA: Diagnosis not present

## 2020-11-11 DIAGNOSIS — Z111 Encounter for screening for respiratory tuberculosis: Secondary | ICD-10-CM | POA: Diagnosis not present

## 2021-02-02 DIAGNOSIS — R2242 Localized swelling, mass and lump, left lower limb: Secondary | ICD-10-CM | POA: Diagnosis not present

## 2021-02-02 DIAGNOSIS — I10 Essential (primary) hypertension: Secondary | ICD-10-CM | POA: Diagnosis not present

## 2021-02-02 DIAGNOSIS — M7989 Other specified soft tissue disorders: Secondary | ICD-10-CM | POA: Diagnosis not present

## 2021-02-02 DIAGNOSIS — Z885 Allergy status to narcotic agent status: Secondary | ICD-10-CM | POA: Diagnosis not present

## 2021-02-02 DIAGNOSIS — E785 Hyperlipidemia, unspecified: Secondary | ICD-10-CM | POA: Diagnosis not present

## 2021-02-02 DIAGNOSIS — M19072 Primary osteoarthritis, left ankle and foot: Secondary | ICD-10-CM | POA: Diagnosis not present

## 2021-02-02 DIAGNOSIS — F17290 Nicotine dependence, other tobacco product, uncomplicated: Secondary | ICD-10-CM | POA: Diagnosis not present

## 2021-02-02 DIAGNOSIS — M79675 Pain in left toe(s): Secondary | ICD-10-CM | POA: Diagnosis not present

## 2021-07-29 DIAGNOSIS — G4719 Other hypersomnia: Secondary | ICD-10-CM | POA: Diagnosis not present

## 2021-08-05 DIAGNOSIS — G4733 Obstructive sleep apnea (adult) (pediatric): Secondary | ICD-10-CM | POA: Diagnosis not present

## 2021-08-12 DIAGNOSIS — Z0000 Encounter for general adult medical examination without abnormal findings: Secondary | ICD-10-CM | POA: Diagnosis not present

## 2021-08-12 DIAGNOSIS — Z125 Encounter for screening for malignant neoplasm of prostate: Secondary | ICD-10-CM | POA: Diagnosis not present

## 2021-08-12 DIAGNOSIS — Z7689 Persons encountering health services in other specified circumstances: Secondary | ICD-10-CM | POA: Diagnosis not present

## 2021-08-12 DIAGNOSIS — Z1211 Encounter for screening for malignant neoplasm of colon: Secondary | ICD-10-CM | POA: Diagnosis not present

## 2021-08-12 DIAGNOSIS — Z20828 Contact with and (suspected) exposure to other viral communicable diseases: Secondary | ICD-10-CM | POA: Diagnosis not present

## 2022-06-03 ENCOUNTER — Telehealth: Payer: Self-pay

## 2022-06-03 ENCOUNTER — Other Ambulatory Visit (HOSPITAL_COMMUNITY): Payer: Self-pay

## 2022-06-03 DIAGNOSIS — E782 Mixed hyperlipidemia: Secondary | ICD-10-CM | POA: Diagnosis not present

## 2022-06-03 DIAGNOSIS — M109 Gout, unspecified: Secondary | ICD-10-CM | POA: Diagnosis not present

## 2022-06-03 DIAGNOSIS — Z8619 Personal history of other infectious and parasitic diseases: Secondary | ICD-10-CM | POA: Diagnosis not present

## 2022-06-03 DIAGNOSIS — R030 Elevated blood-pressure reading, without diagnosis of hypertension: Secondary | ICD-10-CM | POA: Diagnosis not present

## 2022-06-03 NOTE — Telephone Encounter (Signed)
[facility] Patient Product/process development scientist completed.    The patient is insured through [employer].  Medication will need a PA.  We will continue to follow to see if copay assistance is needed.  Clearance Coots, CPhT Specialty Pharmacy Patient [facility] for Infectious Disease Phone: [phone] Fax:  [phone]

## 2022-06-04 ENCOUNTER — Telehealth: Payer: Self-pay

## 2022-06-04 ENCOUNTER — Encounter: Payer: Self-pay | Admitting: Infectious Diseases

## 2022-06-04 ENCOUNTER — Ambulatory Visit (INDEPENDENT_AMBULATORY_CARE_PROVIDER_SITE_OTHER): Payer: BC Managed Care – PPO | Admitting: Infectious Diseases

## 2022-06-04 ENCOUNTER — Other Ambulatory Visit: Payer: Self-pay

## 2022-06-04 VITALS — BP 156/107 | HR 74 | Temp 98.4°F | Ht 72.0 in | Wt 271.0 lb

## 2022-06-04 DIAGNOSIS — Z114 Encounter for screening for human immunodeficiency virus [HIV]: Secondary | ICD-10-CM

## 2022-06-04 DIAGNOSIS — E782 Mixed hyperlipidemia: Secondary | ICD-10-CM | POA: Diagnosis not present

## 2022-06-04 DIAGNOSIS — R768 Other specified abnormal immunological findings in serum: Secondary | ICD-10-CM | POA: Diagnosis not present

## 2022-06-04 DIAGNOSIS — Z789 Other specified health status: Secondary | ICD-10-CM

## 2022-06-04 DIAGNOSIS — Z1159 Encounter for screening for other viral diseases: Secondary | ICD-10-CM | POA: Insufficient documentation

## 2022-06-04 NOTE — Progress Notes (Addendum)
[facility] for Infectious Diseases                                      [address], [place], [place], [address]                                               Phn. [phone]; Fax: [phone]                                                               Date: 06/04/22 Reason for Visit: Hepatitis C   HPI: Jon Suarez is a 51 y.o.old male with PMH of Gout, HLD who is referred from PCP for evaluation and management of Hepatitis C.   Had HCV in August 2007 when he also had jaundice  and had series of tests done over a span of 6 months and was told the virus is out from his body and does not need to be treated by [facility] [place].  He recently had blood test done  by [facility] for blood donation and was told about hepatitis C being positive.   Denies h/o injectable or intranasal cocaine use, blood transfusion,  or sexual contact with known positive partners. He has shared tooth brushes or razor however. Denies personal or family history of liver disease, Hepatitis or Liver cancer.  He has not received treatment to date. Smokes cigar sometimes, alcohol occasionally and denies IVDU. Denies recent hospitalizations and ED visits. Denies appetite and weight changes. 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 He lives with his wife, 2 out of 4 children.  Denies any hospitalizations related to liver disease, ascites, GI bleeding, mental status changes, abdominal pain and acholic stool.   ROS: Denies yellowish discoloration of sclera and skin, abdominal pain/distension, hematemesis.            Denies fever, chills, nightsweats, nausea, vomiting, diarrhea, constipation, weight loss, rashes, joint complaints, shortness of breath, chest pain, headaches, dysuria .  No current outpatient medications on file prior to visit.   No current facility-administered medications on file prior to visit.    Allergies  Allergen Reactions   Codeine      Nausea, loopy, upset stomach    PMH-  Gout, Diverticulitis   PSH -  hernia repair, ? Tonsillectomy   Social History   Socioeconomic History   Marital status: Married    Spouse name: Not on file   Number of children: 4   Years of education: 16   Highest education level: Not on file  Occupational History   Occupation: Geophysical data processor  Tobacco Use   Smoking status: Never   Smokeless tobacco: Never  Vaping Use   Vaping Use: Never used  Substance and Sexual Activity   Alcohol use: Yes    Alcohol/week: 30.0 standard drinks of alcohol    Types: 30 Cans of beer per week    Comment: 6-7 beers 3 x a week   Drug use: No   Sexual activity: Not on file  Other Topics Concern   Not on  file  Social History Narrative   Fun: Drink beer, work around [employer], play basketball, run   Denies religious beliefs effecting health care.    From [place] - moved to [facility] 2014   [place]   Currently at [employer] getting Masters Degree   Social Determinants of Corporate investment banker Strain: Not on [employer] Insecurity: Not on file  Transportation Needs: Not on file  Physical Activity: Not on file  Stress: Not on file  Social Connections: Not on file  Intimate Partner Violence: Not on file   Vitals  BP (!) 156/107   Pulse 74   Temp 98.4 F (36.9 C) (Oral)   Ht 6' (1.829 m)   Wt 271 lb (122.9 kg)   SpO2 97%   BMI 36.75 kg/m    Gen:  no acute distress HEENT: [place]/AT, no scleral icterus, no pale conjunctivae, hearing normal, oral mucosa moist Neck: Supple Cardio: Regular rate and rhythm Resp: Pulmonary effort normal on room air GI: Soft, nontender, nondistended GU: MSK - no pedal edema Skin: No rashes Neuro: Grossly non focal, awake, alert and oriented * 3 Psych: Calm, cooperative  Laboratory     Latest Ref Rng & Units 11/01/2019    8:05 AM 03/14/2015    2:01 PM  CBC  WBC 3.4 - 10.8 x10E3/uL 6.3  7.0   Hemoglobin 13.0 - 17.7 g/dL 64.4  03.4   Hematocrit 37.5  - 51.0 % 48.0  47.8   Platelets 150 - 450 x10E3/uL 235  222.0       Latest Ref Rng & Units 11/01/2019    8:05 AM 03/14/2015    2:01 PM  CMP  Glucose 65 - 99 mg/dL 742  89   BUN 6 - 24 mg/dL 17  20   Creatinine 5.95 - 1.27 mg/dL 6.38  7.56   Sodium 433 - 144 mmol/L 140  139   Potassium 3.5 - 5.2 mmol/L 5.3  4.1   Chloride 96 - 106 mmol/L 100  105   CO2 20 - 29 mmol/L 25  28   Calcium 8.7 - 10.2 mg/dL 29.5  9.5   Total Protein 6.0 - 8.5 g/dL 7.2  6.9   Total Bilirubin 0.0 - 1.2 mg/dL 0.6  0.5   Alkaline Phos 39 - 117 IU/L 73  64   AST 0 - 40 IU/L 38  32   ALT 0 - 44 IU/L 48  44             Assessment/Plan: # Hepatitis C - 10/14/2006 HCV RNA negative  - 8/10 HCV ab reactive, HCV discriminatory NAAT NR  - Check HCV RNA and will decide for need to treat and further labs pending HCV RNA. Patient is agreeable to the plan - Fu to be made after HCV RNA  # Alcohol use - counseled   # Obesity/Hyperlipidemia - Follows with PCP  I have personally spent 65 minutes involved in face-to-face and non-face-to-face activities for this patient on the day of the visit. Professional time spent includes the following activities: Preparing to see the patient (review of tests), Obtaining and/or reviewing separately obtained history (admission/discharge record), Performing a medically appropriate examination and/or evaluation , Ordering medications/tests/procedures, referring and communicating with other health care professionals, Documenting clinical information in the EMR, Independently interpreting results (not separately reported), Communicating results to the patient/family/caregiver, Counseling and educating the patient/family/caregiver and Care coordination (not separately reported).   Patients questions were addressed and answered.  Electronically signed by:  Odette Fraction, MD Infectious Diseases  Office phone [phone] Fax no. [phone]

## 2022-06-04 NOTE — Telephone Encounter (Signed)
Patient was in office today for an office visit. Patient was unable to get labs drawn today due to feeling lightheaded.  Vitals checked on patient Bp 91/62 and HR 77. Patient given graham crackers as well. Patient sat in lab for 10 min before going home and felt well enough to drive home. Patient also offered to lay in a patient room and he stated he felt fine sitting.  Patient will contact the office once he is home.  Emerita Berkemeier Jonathon Resides, CMA

## 2022-06-05 DIAGNOSIS — Z789 Other specified health status: Secondary | ICD-10-CM | POA: Insufficient documentation

## 2022-06-09 ENCOUNTER — Other Ambulatory Visit: Payer: Self-pay

## 2022-06-09 ENCOUNTER — Other Ambulatory Visit: Payer: BC Managed Care – PPO

## 2022-06-09 DIAGNOSIS — R768 Other specified abnormal immunological findings in serum: Secondary | ICD-10-CM

## 2022-06-12 ENCOUNTER — Encounter: Payer: Self-pay | Admitting: Infectious Diseases

## 2022-06-12 LAB — HEPATITIS C RNA QUANTITATIVE
HCV Quantitative Log: <1.18 log IU/mL
HCV RNA, PCR, QN: <15 IU/mL

## 2022-09-09 DIAGNOSIS — Z713 Dietary counseling and surveillance: Secondary | ICD-10-CM | POA: Diagnosis not present

## 2022-09-09 DIAGNOSIS — Z7182 Exercise counseling: Secondary | ICD-10-CM | POA: Diagnosis not present

## 2022-09-09 DIAGNOSIS — I10 Essential (primary) hypertension: Secondary | ICD-10-CM | POA: Diagnosis not present

## 2022-09-09 DIAGNOSIS — F32A Depression, unspecified: Secondary | ICD-10-CM | POA: Diagnosis not present

## 2022-09-09 DIAGNOSIS — Z0000 Encounter for general adult medical examination without abnormal findings: Secondary | ICD-10-CM | POA: Diagnosis not present

## 2022-09-10 DIAGNOSIS — E782 Mixed hyperlipidemia: Secondary | ICD-10-CM | POA: Diagnosis not present

## 2022-09-10 DIAGNOSIS — Z125 Encounter for screening for malignant neoplasm of prostate: Secondary | ICD-10-CM | POA: Diagnosis not present

## 2022-09-10 DIAGNOSIS — Z0000 Encounter for general adult medical examination without abnormal findings: Secondary | ICD-10-CM | POA: Diagnosis not present

## 2022-09-10 DIAGNOSIS — Z131 Encounter for screening for diabetes mellitus: Secondary | ICD-10-CM | POA: Diagnosis not present

## 2022-09-10 DIAGNOSIS — Z114 Encounter for screening for human immunodeficiency virus [HIV]: Secondary | ICD-10-CM | POA: Diagnosis not present

## 2022-09-10 DIAGNOSIS — T148XXA Other injury of unspecified body region, initial encounter: Secondary | ICD-10-CM | POA: Diagnosis not present

## 2022-10-10 DIAGNOSIS — J40 Bronchitis, not specified as acute or chronic: Secondary | ICD-10-CM | POA: Diagnosis not present

## 2022-10-10 DIAGNOSIS — I10 Essential (primary) hypertension: Secondary | ICD-10-CM | POA: Diagnosis not present

## 2023-01-09 DIAGNOSIS — D229 Melanocytic nevi, unspecified: Secondary | ICD-10-CM | POA: Diagnosis not present

## 2023-01-09 DIAGNOSIS — I10 Essential (primary) hypertension: Secondary | ICD-10-CM | POA: Diagnosis not present

## 2023-03-18 DIAGNOSIS — H61003 Unspecified perichondritis of external ear, bilateral: Secondary | ICD-10-CM | POA: Diagnosis not present

## 2024-02-08 ENCOUNTER — Other Ambulatory Visit: Payer: Self-pay | Admitting: Physician Assistant

## 2024-02-08 DIAGNOSIS — Z8249 Family history of ischemic heart disease and other diseases of the circulatory system: Secondary | ICD-10-CM

## 2024-02-08 DIAGNOSIS — E782 Mixed hyperlipidemia: Secondary | ICD-10-CM

## 2024-02-08 DIAGNOSIS — I10 Essential (primary) hypertension: Secondary | ICD-10-CM

## 2024-02-19 ENCOUNTER — Ambulatory Visit
Admission: RE | Admit: 2024-02-19 | Discharge: 2024-02-19 | Disposition: A | Discharge status: Home / Self Care | Source: Ambulatory Visit | Attending: Physician Assistant | Admitting: Physician Assistant

## 2024-02-19 DIAGNOSIS — Z8249 Family history of ischemic heart disease and other diseases of the circulatory system: Secondary | ICD-10-CM

## 2024-02-19 DIAGNOSIS — I10 Essential (primary) hypertension: Secondary | ICD-10-CM

## 2024-02-19 DIAGNOSIS — E782 Mixed hyperlipidemia: Secondary | ICD-10-CM
